# Patient Record
Sex: Female | Born: 1945 | Race: Black or African American | Hispanic: No | Marital: Married | Smoking: Never smoker
Health system: Southern US, Community
[De-identification: ages and names within clinical notes are randomized; demographics above are authoritative.]

## PROBLEM LIST (undated history)

## (undated) DIAGNOSIS — M109 Gout, unspecified: Secondary | ICD-10-CM

## (undated) DIAGNOSIS — I10 Essential (primary) hypertension: Secondary | ICD-10-CM

## (undated) DIAGNOSIS — E539 Vitamin B deficiency, unspecified: Secondary | ICD-10-CM

## (undated) HISTORY — DX: Essential (primary) hypertension: I10

## (undated) HISTORY — DX: Gout, unspecified: M10.9

## (undated) HISTORY — DX: Vitamin B deficiency, unspecified: E53.9

---

## 1998-10-11 ENCOUNTER — Encounter: Payer: Self-pay | Admitting: Internal Medicine

## 1998-10-11 ENCOUNTER — Ambulatory Visit (HOSPITAL_COMMUNITY): Admission: RE | Admit: 1998-10-11 | Discharge: 1998-10-11 | Payer: Self-pay | Admitting: Internal Medicine

## 1998-10-21 ENCOUNTER — Ambulatory Visit (HOSPITAL_COMMUNITY): Admission: RE | Admit: 1998-10-21 | Discharge: 1998-10-21 | Payer: Self-pay | Admitting: Internal Medicine

## 1999-10-13 ENCOUNTER — Ambulatory Visit (HOSPITAL_COMMUNITY): Admission: RE | Admit: 1999-10-13 | Discharge: 1999-10-13 | Payer: Self-pay | Admitting: Internal Medicine

## 1999-10-13 ENCOUNTER — Encounter: Payer: Self-pay | Admitting: Internal Medicine

## 1999-12-11 ENCOUNTER — Other Ambulatory Visit: Admission: RE | Admit: 1999-12-11 | Discharge: 1999-12-11 | Payer: Self-pay | Admitting: Internal Medicine

## 2000-10-14 ENCOUNTER — Ambulatory Visit (HOSPITAL_COMMUNITY): Admission: RE | Admit: 2000-10-14 | Discharge: 2000-10-14 | Payer: Self-pay | Admitting: Internal Medicine

## 2000-10-14 ENCOUNTER — Encounter: Payer: Self-pay | Admitting: Internal Medicine

## 2000-12-08 ENCOUNTER — Other Ambulatory Visit: Admission: RE | Admit: 2000-12-08 | Discharge: 2000-12-08 | Payer: Self-pay | Admitting: Internal Medicine

## 2001-03-31 ENCOUNTER — Ambulatory Visit (HOSPITAL_COMMUNITY): Admission: RE | Admit: 2001-03-31 | Discharge: 2001-03-31 | Payer: Self-pay | Admitting: Gastroenterology

## 2001-11-01 ENCOUNTER — Encounter: Payer: Self-pay | Admitting: Internal Medicine

## 2001-11-01 ENCOUNTER — Ambulatory Visit (HOSPITAL_COMMUNITY): Admission: RE | Admit: 2001-11-01 | Discharge: 2001-11-01 | Payer: Self-pay | Admitting: Internal Medicine

## 2002-01-27 ENCOUNTER — Other Ambulatory Visit: Admission: RE | Admit: 2002-01-27 | Discharge: 2002-01-27 | Payer: Self-pay | Admitting: Internal Medicine

## 2002-11-03 ENCOUNTER — Encounter: Payer: Self-pay | Admitting: Internal Medicine

## 2002-11-03 ENCOUNTER — Ambulatory Visit (HOSPITAL_COMMUNITY): Admission: RE | Admit: 2002-11-03 | Discharge: 2002-11-03 | Payer: Self-pay | Admitting: Internal Medicine

## 2003-12-12 ENCOUNTER — Ambulatory Visit (HOSPITAL_COMMUNITY): Admission: RE | Admit: 2003-12-12 | Discharge: 2003-12-12 | Payer: Self-pay | Admitting: Internal Medicine

## 2004-03-09 ENCOUNTER — Emergency Department (HOSPITAL_COMMUNITY): Admission: EM | Admit: 2004-03-09 | Discharge: 2004-03-09 | Payer: Self-pay | Admitting: Emergency Medicine

## 2005-01-08 ENCOUNTER — Ambulatory Visit (HOSPITAL_COMMUNITY): Admission: RE | Admit: 2005-01-08 | Discharge: 2005-01-08 | Payer: Self-pay | Admitting: Internal Medicine

## 2005-01-29 ENCOUNTER — Other Ambulatory Visit: Admission: RE | Admit: 2005-01-29 | Discharge: 2005-01-29 | Payer: Self-pay | Admitting: Internal Medicine

## 2005-06-25 ENCOUNTER — Ambulatory Visit (HOSPITAL_COMMUNITY): Admission: RE | Admit: 2005-06-25 | Discharge: 2005-06-25 | Payer: Self-pay | Admitting: Internal Medicine

## 2005-09-30 ENCOUNTER — Encounter: Payer: Self-pay | Admitting: Internal Medicine

## 2006-01-28 ENCOUNTER — Ambulatory Visit (HOSPITAL_COMMUNITY): Admission: RE | Admit: 2006-01-28 | Discharge: 2006-01-28 | Payer: Self-pay | Admitting: Internal Medicine

## 2007-02-14 ENCOUNTER — Ambulatory Visit (HOSPITAL_COMMUNITY): Admission: RE | Admit: 2007-02-14 | Discharge: 2007-02-14 | Payer: Self-pay | Admitting: Internal Medicine

## 2007-05-02 ENCOUNTER — Ambulatory Visit (HOSPITAL_COMMUNITY): Admission: RE | Admit: 2007-05-02 | Discharge: 2007-05-02 | Payer: Self-pay | Admitting: Internal Medicine

## 2007-05-02 ENCOUNTER — Ambulatory Visit: Payer: Self-pay | Admitting: Vascular Surgery

## 2007-05-02 ENCOUNTER — Encounter (INDEPENDENT_AMBULATORY_CARE_PROVIDER_SITE_OTHER): Payer: Self-pay | Admitting: Internal Medicine

## 2008-03-02 ENCOUNTER — Ambulatory Visit (HOSPITAL_COMMUNITY): Admission: RE | Admit: 2008-03-02 | Discharge: 2008-03-02 | Payer: Self-pay | Admitting: Internal Medicine

## 2008-04-24 ENCOUNTER — Other Ambulatory Visit: Admission: RE | Admit: 2008-04-24 | Discharge: 2008-04-24 | Payer: Self-pay | Admitting: Obstetrics and Gynecology

## 2008-04-24 ENCOUNTER — Encounter: Payer: Self-pay | Admitting: Obstetrics and Gynecology

## 2008-04-24 ENCOUNTER — Ambulatory Visit: Payer: Self-pay | Admitting: Obstetrics and Gynecology

## 2008-05-08 ENCOUNTER — Ambulatory Visit: Payer: Self-pay | Admitting: Obstetrics and Gynecology

## 2008-06-26 ENCOUNTER — Ambulatory Visit: Payer: Self-pay | Admitting: Obstetrics and Gynecology

## 2008-06-28 ENCOUNTER — Encounter: Payer: Self-pay | Admitting: Obstetrics and Gynecology

## 2008-06-28 ENCOUNTER — Ambulatory Visit (HOSPITAL_BASED_OUTPATIENT_CLINIC_OR_DEPARTMENT_OTHER): Admission: RE | Admit: 2008-06-28 | Discharge: 2008-06-28 | Payer: Self-pay | Admitting: Obstetrics and Gynecology

## 2008-06-28 ENCOUNTER — Ambulatory Visit: Payer: Self-pay | Admitting: Obstetrics and Gynecology

## 2008-07-12 ENCOUNTER — Ambulatory Visit: Payer: Self-pay | Admitting: Obstetrics and Gynecology

## 2009-06-27 ENCOUNTER — Ambulatory Visit (HOSPITAL_COMMUNITY): Admission: RE | Admit: 2009-06-27 | Discharge: 2009-06-27 | Payer: Self-pay | Admitting: Internal Medicine

## 2009-10-11 ENCOUNTER — Encounter: Admission: RE | Admit: 2009-10-11 | Discharge: 2009-10-11 | Payer: Self-pay | Admitting: Internal Medicine

## 2010-06-30 ENCOUNTER — Ambulatory Visit (HOSPITAL_COMMUNITY)
Admission: RE | Admit: 2010-06-30 | Discharge: 2010-06-30 | Payer: Self-pay | Source: Home / Self Care | Attending: Internal Medicine | Admitting: Internal Medicine

## 2010-09-23 ENCOUNTER — Other Ambulatory Visit: Payer: Self-pay | Admitting: Obstetrics and Gynecology

## 2010-09-23 ENCOUNTER — Other Ambulatory Visit (HOSPITAL_COMMUNITY)
Admission: RE | Admit: 2010-09-23 | Discharge: 2010-09-23 | Disposition: A | Discharge status: Home / Self Care | Payer: BC Managed Care – PPO | Source: Ambulatory Visit | Attending: Obstetrics and Gynecology | Admitting: Obstetrics and Gynecology

## 2010-09-23 ENCOUNTER — Encounter (INDEPENDENT_AMBULATORY_CARE_PROVIDER_SITE_OTHER): Payer: BC Managed Care – PPO | Admitting: Obstetrics and Gynecology

## 2010-09-23 DIAGNOSIS — R82998 Other abnormal findings in urine: Secondary | ICD-10-CM

## 2010-09-23 DIAGNOSIS — Z01419 Encounter for gynecological examination (general) (routine) without abnormal findings: Secondary | ICD-10-CM

## 2010-09-23 DIAGNOSIS — Z124 Encounter for screening for malignant neoplasm of cervix: Secondary | ICD-10-CM | POA: Insufficient documentation

## 2010-10-14 NOTE — Op Note (Signed)
Kristi [name], [name]              ACCOUNT NO.:  [id number]   MEDICAL RECORD NO.:  [id number]          PATIENT TYPE:  AMB   LOCATION:  [facility]                         FACILITY:  [facility]   PHYSICIAN:  Daniel L. Gottsegen, M.D.DATE OF BIRTH:  01-09-1946   DATE OF PROCEDURE:  06/28/2008  DATE OF DISCHARGE:                               OPERATIVE REPORT   PREOPERATIVE DIAGNOSIS:  Postmenopausal bleeding, endometrial polyps  retained intrauterine device.   POSTOPERATIVE DIAGNOSIS:  Postmenopausal bleeding, endometrial polyps,  endocervical polyp.  No intrauterine device seen.   OPERATION:  Hysteroscopy with excision of all polyps.   SURGEON:  Dr. Eda Paschal   ANESTHESIA:  General.   INDICATIONS:  The patient is a 65 year old gravida 6, para 1, AB 5 who  had presented to the office with a history postmenopausal bleeding after  not having had a period since the year 2000.  She was not taking hormone  replacement and therefore this was abnormal bleeding.  She was seen in  the office. Endometrial biopsy and Pap smear were both obtained, both of  which were normal. Saline infusion hysterogram was done. The patient had  several myomas and several endometrial polyps were seen which were felt  to probably be the source of the above. Her IUD could not be seen. One  of the myomas appeared to encroach on the endometrial cavity.  She now  enters the hospital for hysteroscopy D&C, excision of endometrial polyps  and excision of retained IUD if present.   FINDINGS:  External is normal.  BUS is normal.  Vaginal is normal.  Cervix is clean.  Uterus is top normal size and shape.  Adnexa failed to  reveal masses.  There is first-degree uterine descensus.   At the time of hysteroscopy, the patient had two well-defined  endometrial polyps.  The first was in the lower uterine segment at  approximately six o'clock, the second was in the right fundal area  higher up on the lateral wall.  Both polyps were  about 1 to 1.5 cm.  The  top of the fundus, tubal ostia, endometrial cavity was completely  visualized and there was absolutely no sign of a retained IUD.  Endocervical exam showed a polyp in the cervix coming off at 12 o'clock.  It was also about 1 cm.   PROCEDURE:  After adequate general anesthesia the patient was placed in  the dorsal lithotomy position, prepped and draped in usual sterile  manner.  A single-tooth tenaculum was placed in the anterior lip of the  cervix.  The cervix dilated to #31 [facility] dilator. Hysteroscopic  resectoscope was introduced into the endometrial cavity.  3% sorbitol  was used to expand the intrauterine cavity and a camera was used for  magnification.  A 90 degree wire loop with appropriate Bovie settings  was used.  The endometrial cavity was entered.  First a search was made  for the IUD and no IUD could be seen.  It was felt that probably the  patient had passed it as it was also not seen in the office on  ultrasound.  The two  endometrial polyps were removed with a 90 degree  wire loop by resection of the above.  A myoma was noted to impinge on  the endometrial cavity but it was clearly not in the cavity in any way.  Endocervical polyp was encountered and it was also excised with a 90  degree wire loop.  There was some bleeding from all three sites and they  were controlled with cautery.  At the termination of the procedure there  was absolutely no bleeding  noted.  The two endometrial polyps and the one endocervical polyp were  all sent to pathology for tissue diagnosis.  Blood loss was minimal.  Fluid deficit was 200 mL.  The patient tolerated procedure well and left  the operating room in satisfactory condition.      Daniel L. Eda Paschal, M.D.  Electronically Signed     DLG/MEDQ  D:  06/28/2008  T:  06/28/2008  Job:  [id number]

## 2010-10-17 NOTE — Procedures (Signed)
[facility]  Patient:    Kristi [name], Kristi [name] Visit Number: [id number] MRN: [id number]          Service Type: END Location: ENDO Attending Physician:  Charna Elizabeth Dictated by:   Anselmo Rod, M.D. Proc. Date: 03/31/01 Admit Date:  03/31/2001   CC:         Erskine Speed, M.D.   Procedure Report  DATE OF BIRTH:  Oct 05, 1945.  PROCEDURE:  Colonoscopy.  ENDOSCOPIST:  Anselmo Rod, M.D.  INSTRUMENT USED:  Olympus video colonoscope.  INDICATION FOR PROCEDURE:  Rectal bleeding in a 65 year old African-American female.  Rule out colonic polyps, masses, hemorrhoids, etc.  PREPROCEDURE PREPARATION:  Informed consent was procured from the patient. The patient was fasted for eight hours prior to the procedure and prepped with a bottle of magnesium citrate and a gallon of NuLytely the night prior to the procedure.  PREPROCEDURE PHYSICAL:  VITAL SIGNS:  The patient had stable vital signs.  NECK:  Supple.  CHEST:  Clear to auscultation.  S1, S2 regular.  ABDOMEN:  Soft with normal bowel sounds.  DESCRIPTION OF PROCEDURE:  The patient was placed in the left lateral decubitus position and sedated with 70 mg of Demerol and 7 mg of Versed intravenously.  Once the patient was adequately sedate and maintained on low-flow oxygen and continuous cardiac monitoring, the Olympus video colonoscope was advanced from the rectum to the cecum without difficulty.  The patient has a healthy-appearing colon except for a few scattered diverticula. Small internal hemorrhoids were appreciated on retroflexion in the rectum. The procedure was complete up to the terminal ileum.  The ileocecal valve and the appendiceal orifice were clearly visualized.  IMPRESSION: 1. A healthy-appearing colon except for a few scattered diverticula. 2. Small internal hemorrhoids.  RECOMMENDATIONS: 1. A high-fiber diet has been discussed with the patient in great detail  and    brochures given to her for her education. 2. Outpatient follow-up is advised on a p.r.n. basis. 3. Repeat colorectal cancer screening is recommended in the next five years    unless the patient were to develop any abnormal symptoms in the interim. Dictated by:   Anselmo Rod, M.D. Attending Physician:  Charna Elizabeth DD:  03/31/01 TD:  04/01/01 Job: [id number]

## 2011-08-24 ENCOUNTER — Other Ambulatory Visit (HOSPITAL_COMMUNITY): Payer: Self-pay | Admitting: Internal Medicine

## 2011-08-24 DIAGNOSIS — Z1231 Encounter for screening mammogram for malignant neoplasm of breast: Secondary | ICD-10-CM

## 2011-09-16 ENCOUNTER — Ambulatory Visit (HOSPITAL_COMMUNITY)
Admission: RE | Admit: 2011-09-16 | Discharge: 2011-09-16 | Disposition: A | Discharge status: Home / Self Care | Payer: Medicare Other | Source: Ambulatory Visit | Attending: Internal Medicine | Admitting: Internal Medicine

## 2011-09-16 DIAGNOSIS — Z1231 Encounter for screening mammogram for malignant neoplasm of breast: Secondary | ICD-10-CM | POA: Insufficient documentation

## 2011-11-09 ENCOUNTER — Other Ambulatory Visit: Payer: Self-pay | Admitting: Internal Medicine

## 2011-11-09 ENCOUNTER — Ambulatory Visit
Admission: RE | Admit: 2011-11-09 | Discharge: 2011-11-09 | Disposition: A | Discharge status: Home / Self Care | Payer: BC Managed Care – PPO | Source: Ambulatory Visit | Attending: Internal Medicine | Admitting: Internal Medicine

## 2011-11-09 DIAGNOSIS — R52 Pain, unspecified: Secondary | ICD-10-CM

## 2012-10-12 ENCOUNTER — Other Ambulatory Visit (HOSPITAL_COMMUNITY): Payer: Self-pay | Admitting: Internal Medicine

## 2012-10-12 DIAGNOSIS — Z1231 Encounter for screening mammogram for malignant neoplasm of breast: Secondary | ICD-10-CM

## 2012-10-19 ENCOUNTER — Ambulatory Visit (HOSPITAL_COMMUNITY): Payer: Medicare Other

## 2012-10-26 ENCOUNTER — Ambulatory Visit (HOSPITAL_COMMUNITY)
Admission: RE | Admit: 2012-10-26 | Discharge: 2012-10-26 | Disposition: A | Discharge status: Home / Self Care | Payer: Medicare Other | Source: Ambulatory Visit | Attending: Internal Medicine | Admitting: Internal Medicine

## 2012-10-26 DIAGNOSIS — Z1231 Encounter for screening mammogram for malignant neoplasm of breast: Secondary | ICD-10-CM

## 2013-09-25 ENCOUNTER — Other Ambulatory Visit (HOSPITAL_COMMUNITY): Payer: Self-pay | Admitting: Internal Medicine

## 2013-09-25 DIAGNOSIS — Z1231 Encounter for screening mammogram for malignant neoplasm of breast: Secondary | ICD-10-CM

## 2013-10-27 ENCOUNTER — Ambulatory Visit (HOSPITAL_COMMUNITY)
Admission: RE | Admit: 2013-10-27 | Discharge: 2013-10-27 | Disposition: A | Discharge status: Home / Self Care | Payer: Medicare Other | Source: Ambulatory Visit | Attending: Internal Medicine | Admitting: Internal Medicine

## 2013-10-27 DIAGNOSIS — Z1231 Encounter for screening mammogram for malignant neoplasm of breast: Secondary | ICD-10-CM | POA: Insufficient documentation

## 2013-11-21 ENCOUNTER — Ambulatory Visit
Admission: RE | Admit: 2013-11-21 | Discharge: 2013-11-21 | Disposition: A | Discharge status: Home / Self Care | Payer: 59 | Source: Ambulatory Visit | Attending: Internal Medicine | Admitting: Internal Medicine

## 2013-11-21 ENCOUNTER — Other Ambulatory Visit: Payer: Self-pay | Admitting: Internal Medicine

## 2013-11-21 DIAGNOSIS — R52 Pain, unspecified: Secondary | ICD-10-CM

## 2013-12-06 ENCOUNTER — Other Ambulatory Visit: Payer: Self-pay | Admitting: Internal Medicine

## 2013-12-06 DIAGNOSIS — R410 Disorientation, unspecified: Secondary | ICD-10-CM

## 2013-12-12 ENCOUNTER — Ambulatory Visit
Admission: RE | Admit: 2013-12-12 | Discharge: 2013-12-12 | Disposition: A | Discharge status: Home / Self Care | Payer: 59 | Source: Ambulatory Visit | Attending: Internal Medicine | Admitting: Internal Medicine

## 2013-12-12 DIAGNOSIS — R410 Disorientation, unspecified: Secondary | ICD-10-CM

## 2014-01-22 ENCOUNTER — Ambulatory Visit: Payer: Medicare Other | Admitting: Neurology

## 2014-01-25 ENCOUNTER — Ambulatory Visit: Payer: Medicare Other | Admitting: Neurology

## 2014-01-30 ENCOUNTER — Encounter: Payer: Self-pay | Admitting: Neurology

## 2014-01-30 ENCOUNTER — Ambulatory Visit (INDEPENDENT_AMBULATORY_CARE_PROVIDER_SITE_OTHER): Payer: Medicare Other | Admitting: Neurology

## 2014-01-30 VITALS — BP 150/80 | HR 78 | Temp 97.0°F | Ht 68.0 in | Wt 164.0 lb

## 2014-01-30 DIAGNOSIS — F0390 Unspecified dementia without behavioral disturbance: Secondary | ICD-10-CM

## 2014-01-30 MED ORDER — DONEPEZIL HCL 5 MG PO TABS: 5.0000 mg | ORAL_TABLET | Freq: Every day | ORAL | Status: DC

## 2014-01-30 NOTE — Progress Notes (Signed)
Subjective:    Patient ID: Kristi [name] is a 68 y.o. female.  HPI    Kristi Foley, MD, PhD [facility] [address] [address] [place], [place] [address]  Dear Dr. Chilton Si,   I saw your patient, Kristi [name], upon your kind request in my neurologic clinic today for initial consultation of her memory loss. The patient is accompanied by her husband, Kristi [name], today. As you know, Kristi [name] is a 68 year old right-handed woman with an underlying medical history of hypertension and arthritis/gout, who has had memory loss for the past 6-12 months. She had a brain MRI without contrast on 12/12/2013: 1. No evidence of acute intracranial abnormality or mass. 2. Mild chronic small vessel ischemic disease and mild cerebral atrophy. In addition I personally reviewed the images through the PACS system. She had blood work in your office on 11/28/2013 which showed a normal CBC with differential, normal CMP, normal thyroid function test, elevated LDL at 123, normal vitamin B12 at 346. She has had problems with short-term memory over the past, worse over the past 5-6 months. She got lost driving one time recently when picking up her grandchild. She became confused recently one time while grocery shopping. She was the only child and took care of both her parents. Her father lived to be 18 and died in 2014-07-12and her mother died 10 years earlier at 68. She has 2 grown daughters and they have 2 GC. 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 She does not smoke or drink EtOH. There is no FHx of dementia.  She primarily has been having difficulty with short-term memory such as forgetfulness, misplacing things, asking the same question again and forgetting dates and events. There is report of rare confusion or disorientation. Familiar faces are easily recognized. She reports no recurrent headaches, but reports one recent mild HA. She denies VH  and AH and delusions.  She denies depression. She denies SI/HI. She had a lot of stress when she took care of her parents.   The patient denies prior TIA or stroke symptoms, such as sudden onset of one sided weakness, numbness, tingling, slurring of speech or droopy face, hearing loss, tinnitus, diplopia or visual field cut or monocular loss of vision, and denies recurrent headaches.   Of note, the patient has very mild snoring, and there is no report of witnessed apneas or choking sensations while asleep.   She has reduced her driving.   Her Past Medical History Is Significant For: No past medical history on file.  Her Past Surgical History Is Significant For: No past surgical history on file.  Her Family History Is Significant For: Family History  Problem Relation Age of Onset  . Cancer Mother     skin     Her Social History Is Significant For: History   Social History  . Marital Status: Married    Spouse Name: Kristi [name]    Number of Children: 2  . Years of Education: 16   Occupational History  .      retired for 12 years   Social History Main Topics  . Smoking status: Never Smoker   . Smokeless tobacco: Never Used  . Alcohol Use: No  . Drug Use: No  . Sexual Activity: None   Other Topics Concern  . None   Social History Narrative   Patient is right handed and resides with husband    Her Allergies Are:  Not on File:   Her Current Medications Are:  No outpatient encounter prescriptions on file as of 01/30/2014.  :  Review of Systems:  Out of a complete 14 point review of systems, all are reviewed and negative with the exception of these symptoms as listed below:    Review of Systems  Cardiovascular:       Swelling in left foot, sometimes right(once)  Neurological:       Memory loss    Objective:  Neurologic Exam  Physical Exam Physical Examination:   Filed Vitals:   01/30/14 1357  BP: 150/80  Pulse: 78  Temp: 97 F (36.1 C)    General  Examination: The patient is a very pleasant 68 y.o. female in no acute distress. She is calm and cooperative with the exam. She denies Auditory Hallucinations and Visual Hallucinations. She is very well groomed and situated in a chair. She appears anxious/nervous.   HEENT: Normocephalic, atraumatic, pupils are equal, round and reactive to light and accommodation. Bilateral mild cataracts noted. Funduscopic exam is normal with sharp disc margins noted. Extraocular tracking shows no saccadic breakdown without nystagmus noted. Hearing is intact. Tympanic membranes are clear bilaterally. Face is symmetric with no facial masking and normal facial sensation. There is no lip, neck or jaw tremor. Neck is not rigid with intact passive ROM. There are no carotid bruits on auscultation. Oropharynx exam reveals moderate mouth dryness. No significant airway crowding is noted. Mallampati is class II. Tongue protrudes centrally and palate elevates symmetrically. Tonsils are absent.   Chest: is clear to auscultation without wheezing, rhonchi or crackles noted.  Heart: sounds are regular and normal without murmurs, rubs or gallops noted.   Abdomen: is soft, non-tender and non-distended with normal bowel sounds appreciated on auscultation.  Extremities: There is 1+ pitting edema in the distal lower extremities bilaterally. Pedal pulses are intact.   Skin: is warm and dry with no trophic changes noted.   Musculoskeletal: exam reveals no obvious joint deformities, tenderness or joint swelling or erythema, except for mild redness dorsum of the L foot and R big toe.   Neurologically:  Mental status: The patient is awake and alert, paying good  attention. She is able to partially provide the history. Her husband provides details. She is oriented to: person, place, situation and year. Her memory, attention, language and knowledge are impaired. There is no aphasia, agnosia, apraxia or anomia. There is a mild degree of  bradyphrenia. Speech is mildly hypophonic with no dysarthria noted. Mood is congruent and affect is normal.  Her MMSE (Mini-Mental state exam) score is 15/30.  CDT (Clock Drawing Test) score is 1/4.  AFT (Animal Fluency Test) score is 10.   Cranial nerves are as described above under HEENT exam. In addition, shoulder shrug is normal with equal shoulder height noted.  Motor exam: Normal bulk, and strength for age is noted. Tone is not rigid with absence of cogwheeling in the extremities. There is overall no bradykinesia. There is no drift or rebound. There is no tremor.  Romberg is negative. Reflexes are 1+ in the upper extremities and 1+ in the lower extremities. Toes are downgoing bilaterally. Fine motor skills: Finger taps, hand movements, and rapid alternating patting are preserved, as well as LE fine motor skills.   Cerebellar testing shows no dysmetria or intention tremor on finger to nose testing. Heel to shin is unremarkable. There is no truncal or gait ataxia.   Sensory exam is intact to light touch, pinprick, vibration,  temperature sense in the upper and lower extremities.   Gait, station and balance: She stands up from the seated position with mild difficulty. No veering to one side is noted. No leaning to one side. Posture is age appropriate. Stance is narrow-based. She turns en bloc and gait is normal.   Assessment and Plan:   In summary, Kristi [name] is a very pleasant 68 y.o.-year old female with an underlying medical history of hypertension and arthritis, who has had memory loss for the past year. She has had progressive short-term memory loss of the past 5-6 months and got lost while driving and familiar neighborhood recently. Her memory scores indicate moderate difficulty with memory. I would like to proceed with more detailed and formal memory testing in the form of neuropsychological evaluation. I had a long chat with the patient and her husband today about memory loss and we  talked about her recent brain MRI findings. She does not have much in the way of vascular risk factors and no family history of dementia. While she denies depression and would like to proceed with formal cognitive testing to also tease out if there is a component of stress, or depression, or mood disorder. Given her history and current findings I would like to go ahead and start the patient on Aricept low dose, 5 mg strength. I talked to her and her husband about this medication, its potential side effects and expectations. She has had recent appropriate blood work and brain scan and other than referral to neuropsychology we do not need to proceed with any diagnostic test at this time.  I answered all their questions today and the patient and Kristi [name] were in agreement with the above outlined plan. I would like to see the patient back in 3 months, sooner if the need arises and encouraged them to call with any interim questions, concerns, problems, updates and refill requests. Today I provided her with a new prescription for donepezil 5 mg strength. She is advised to stay well-hydrated, stay active mentally and physically, and ensure good nutrition.    Thank you very much for allowing me to participate in the care of this nice patient. If I can be of any further assistance to you please do not hesitate to call me at [phone].  Sincerely,   Kristi Foley, MD, PhD

## 2014-01-30 NOTE — Patient Instructions (Addendum)
You have complaints of memory loss: memory loss or changes in cognitive function can have many reasons. Conditions that can contribute to subjective or objective memory loss include: depression, stress, poor sleep from insomnia or sleep apnea, dehydration, fluctuation in blood sugar values, thyroid or electrolyte dysfunction. Dementia can be causes by stroke, brain atherosclerosis and by Alzheimer's disease or other, more rare and sometimes hereditary causes. We will do some additional testing: neuropsychological testing. We will start Aricept (generic name: donepezil) 5 mg: take one pill each evening. Common side effects include dry eyes, dry mouth, confusion, low pulse, low blood pressure and rare side effects include hallucinations.

## 2014-02-02 ENCOUNTER — Telehealth: Payer: Self-pay | Admitting: Neurology

## 2014-02-02 NOTE — Telephone Encounter (Signed)
Shared Dr Teofilo Pod message with patient's husband, he verbalized understanding

## 2014-02-02 NOTE — Telephone Encounter (Signed)
Called for more information on patient driving, lt VM for call back

## 2014-02-02 NOTE — Telephone Encounter (Signed)
Spouse questioning if patient is able to drive?  Please call and advise anytime, may leave detailed message if not available.

## 2014-02-02 NOTE — Telephone Encounter (Signed)
I would recommend that she not resume driving at this time. Let's also await the evaluation with the neuropsychologist.

## 2014-03-07 ENCOUNTER — Telehealth: Payer: Self-pay | Admitting: Neurology

## 2014-03-07 NOTE — Telephone Encounter (Signed)
I called back.  Spoke with Mr Janee Mornhompson.  They will contact pharmacy to request a refill.  I explained ins may not pay if it is too soon.  He will check with pharmacy and call us back if needed.  I called the pharmacy.  Spoke with Trinna PostAlex.  He said they will try to get a lost override, but some ins will not allow this.  I looked online at goodRx.com and printed/faxed a voucher to the pharmacy for Aricept.  In the event ins will not pay, the pharmacy can use this voucher and it will cost $27 for the patient to fill.  The info is as follows: Member ID: [id number] Rx Group: [id number] Rx Bin: [id number] Rx PCN: [id number]

## 2014-03-07 NOTE — Telephone Encounter (Signed)
Patient's spouse stated  donepezil (ARICEPT) 5 MG tablet medication has been lost and no where to be found.  Patient missed dose last night.  Please call and advise.

## 2014-04-24 ENCOUNTER — Other Ambulatory Visit: Payer: Self-pay | Admitting: Neurology

## 2014-05-01 ENCOUNTER — Ambulatory Visit (INDEPENDENT_AMBULATORY_CARE_PROVIDER_SITE_OTHER): Payer: Medicare Other | Admitting: Neurology

## 2014-05-01 ENCOUNTER — Encounter: Payer: Self-pay | Admitting: Neurology

## 2014-05-01 VITALS — BP 117/72 | HR 73 | Temp 98.6°F | Ht 68.0 in | Wt 162.0 lb

## 2014-05-01 DIAGNOSIS — F0390 Unspecified dementia without behavioral disturbance: Secondary | ICD-10-CM

## 2014-05-01 MED ORDER — DONEPEZIL HCL 10 MG PO TABS: 10.0000 mg | ORAL_TABLET | Freq: Every day | ORAL | Status: DC

## 2014-05-01 NOTE — Progress Notes (Signed)
Subjective:    Patient ID: Kristi [name] is a 68 y.o. female.  HPI     Interim history:  Kristi [name] is a 68 year old right-handed woman with an underlying medical history of hypertension and arthritis/gout, who presents for follow-up consultation of her memory loss. She's accompanied by her husband again today. I first met her on 01/30/2014, at which time she reported an approximately 6-12 month history of progressive memory loss. She had had recent blood work and a brain MRI which we discussed. I referred her for formal memory testing in the form of neuropsychological evaluation and she is been scheduled with Dr. Valentina Shaggy in January 2016. I suggested we start the patient on low-dose Aricept 5 mg once daily. I advised her not to resume driving at this time.  Today, she reports feeling stable. She has been able to tolerate low-dose Aricept. He does not report any acute issues. She does not notice any side effects. She is sometimes frustrated at herself. 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 She still has a tendency to try to help other people with their problems and sometimes it weighs heavily on her. She is trying to drink more water.  She had a brain MRI without contrast on 12/12/2013: 1. No evidence of acute intracranial abnormality or mass. 2. Mild chronic small vessel ischemic disease and mild cerebral atrophy. In addition I personally reviewed the images through the PACS system. She had blood work with her PCP on 11/28/2013 which showed a normal CBC with differential, normal CMP, normal thyroid function test, elevated LDL at 123, low normal vitamin B12 at 346. She has had problems with short-term memory over the past year, worse over the past 5-6 months. She got lost driving one time recently when picking up her grandchild. She became confused recently one time while grocery shopping. She is an only child and took care of both her parents. Her father lived to be 21 and died  in 12/27/12 and her mother died 40 years earlier at 64. She has 2 grown daughters and they have 2 GC. 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 She does not smoke or drink EtOH. There is no FHx of dementia.   She primarily has been having difficulty with short-term memory such as forgetfulness, misplacing things, asking the same question again and forgetting dates and events. There is report of rare confusion or disorientation. Familiar faces are easily recognized. She reports no recurrent headaches, but reports one recent mild HA. She denies VH and AH and delusions.   She denies depression. She denies SI/HI. She had a lot of stress when she took care of her parents.  The patient denies prior TIA or stroke symptoms, such as sudden onset of one sided weakness, numbness, tingling, slurring of speech or droopy face, hearing loss, tinnitus, diplopia or visual field cut or monocular loss of vision, and denies recurrent headaches.  Of note, the patient has very mild snoring, and there is no report of witnessed apneas or choking sensations while asleep.    Her Past Medical History Is Significant For: History reviewed. No pertinent past medical history.  Her Past Surgical History Is Significant For: History reviewed. No pertinent past surgical history.  Her Family History Is Significant For: Family History  Problem Relation Age of Onset  . Cancer Mother     skin     Her Social History Is Significant For: History   Social  History  . Marital Status: Married    Spouse Name: [name]    Number of Children: 2  . Years of Education: 16   Occupational History  .      retired for 12 years   Social History Main Topics  . Smoking status: Never Smoker   . Smokeless tobacco: Never Used  . Alcohol Use: No  . Drug Use: No  . Sexual Activity: None   Other Topics Concern  . None   Social History Narrative   Patient is right handed and resides with husband     Her Allergies Are:  Not on File:   Her Current Medications Are:  Outpatient Encounter Prescriptions as of 05/01/2014  Medication Sig  . allopurinol (ZYLOPRIM) 300 MG tablet Take 300 mg by mouth daily.  . hydrochlorothiazide (HYDRODIURIL) 25 MG tablet Take 25 mg by mouth daily.  Marland Kitchen losartan (COZAAR) 100 MG tablet Take 100 mg by mouth daily.  . [DISCONTINUED] donepezil (ARICEPT) 5 MG tablet TAKE 1 TABLET (5 MG TOTAL) BY MOUTH AT BEDTIME.  . colchicine 0.6 MG tablet Take 0.6 mg by mouth daily.  Marland Kitchen donepezil (ARICEPT) 10 MG tablet Take 1 tablet (10 mg total) by mouth at bedtime.  . [DISCONTINUED] cephALEXin (KEFLEX) 500 MG capsule Take 500 mg by mouth 2 (two) times daily.  . [DISCONTINUED] doxycycline (VIBRA-TABS) 100 MG tablet Take 100 mg by mouth 2 (two) times daily.  . [DISCONTINUED] etodolac (LODINE) 400 MG tablet Take 400 mg by mouth 3 (three) times daily with meals.  :  Review of Systems:  Out of a complete 14 point review of systems, all are reviewed and negative with the exception of these symptoms as listed below:   Review of Systems  All other systems reviewed and are negative.  No new symptoms.  Objective:  Neurologic Exam  Physical Exam Physical Examination:   Filed Vitals:   05/01/14 0934  BP: 117/72  Pulse: 73  Temp: 98.6 F (37 C)    General Examination: The patient is a very pleasant 68 y.o. female in no acute distress. She is calm and cooperative with the exam. She is very well groomed and situated in a chair. She appears slightly anxious/nervous.   HEENT: Normocephalic, atraumatic, pupils are equal, round and reactive to light and accommodation. Bilateral mild cataracts noted. Funduscopic exam is normal with sharp disc margins noted. Extraocular tracking shows no saccadic breakdown without nystagmus noted. Hearing is intact. Tympanic membranes are clear bilaterally. Face is symmetric with no facial masking and normal facial sensation. There is no lip, neck or  jaw tremor. Neck is not rigid with intact passive ROM. There are no carotid bruits on auscultation. Oropharynx exam reveals moderate mouth dryness. No significant airway crowding is noted. Mallampati is class II. Tongue protrudes centrally and palate elevates symmetrically. Tonsils are absent.   Chest: is clear to auscultation without wheezing, rhonchi or crackles noted.  Heart: sounds are regular and normal without murmurs, rubs or gallops noted.   Abdomen: is soft, non-tender and non-distended with normal bowel sounds appreciated on auscultation.  Extremities: There is no pitting edema in the distal lower extremities bilaterally. Pedal pulses are intact.   Skin: is warm and dry with no trophic changes noted.   Musculoskeletal: exam reveals no obvious joint deformities, tenderness or joint swelling or erythema.   Neurologically:  Mental status: The patient is awake and alert, paying good  attention. She is able to partially provide the history. Her husband provides details. She  is oriented to: person, place, situation and year. Her memory, attention, language and knowledge are impaired. There is no aphasia, agnosia, apraxia or anomia. There is a mild degree of bradyphrenia. Speech is mildly hypophonic with no dysarthria noted. Mood is congruent and affect is normal.   On 01/30/14: Her MMSE (Mini-Mental state exam) score is 15/30. CDT (Clock Drawing Test) score is 1/4. AFT (Animal Fluency Test) score is 10.   On 05/01/2014: MMSE: 15/30, CDT 1/4, AFT 8, geriatric depression score is 3 out of 15.   Cranial nerves are as described above under HEENT exam. In addition, shoulder shrug is normal with equal shoulder height noted.  Motor exam: Normal bulk, and strength for age is noted. Tone is not rigid with absence of cogwheeling in the extremities. There is overall no bradykinesia. There is no drift or rebound. There is no tremor.  Romberg is negative. Reflexes are 1+ in the upper extremities and 1+  in the lower extremities. Toes are downgoing bilaterally. Fine motor skills: Finger taps, hand movements, and rapid alternating patting are preserved, as well as LE fine motor skills.   Cerebellar testing shows no dysmetria or intention tremor on finger to nose testing. Heel to shin is unremarkable. There is no truncal or gait ataxia.   Sensory exam is intact to light touch, pinprick, vibration, temperature sense in the upper and lower extremities.   Gait, station and balance: She stands up from the seated position with mild difficulty. No veering to one side is noted. No leaning to one side. Posture is age appropriate. Stance is narrow-based. She turns en bloc and gait is normal.   Assessment and Plan:   In summary, BRYNNAN RODENBAUGH is a very pleasant 68 year old female with an underlying medical history of hypertension and arthritis, who has had memory loss for the past year. She has had progressive short-term memory loss of the past 6-9 months and got lost while driving in a familiar neighborhood. Her memory scores indicate moderate difficulty with memory, but numbers are generally speaking stable from September 2015. We will await more detailed and formal memory testing in the form of neuropsychological evaluation which is scheduled for January next year.  she has an appointment with me in March of next year which would be a good time to reevaluate her. In the interim, would like to increase her Aricept to 10 mg once daily. She has been able to tolerated at 5 mg strength. She is encouraged to drink more water. At some point next year we will repeat her brain MRI.   I answered all their questions today and the patient and [name] were in agreement with the above outlined plan. I would like to see the patient back in 3 months, sooner if the need arises and encouraged them to call with any interim questions, concerns, problems, updates and refill requests. Today I provided her with a new prescription for  donepezil 10 mg strength. She is advised to stay well-hydrated, stay active mentally and physically, and ensure good nutrition.

## 2014-05-01 NOTE — Patient Instructions (Signed)
We will await Dr. Maxwell MarionZelson's assessment in January. In the meantime, let's increase your Aricept to 10 mg daily. You can use the 5 mg pills and use 2 daily until finished and then you can fill the 10 mg pills.  I will see you back in 3 months.

## 2014-06-12 DIAGNOSIS — F0390 Unspecified dementia without behavioral disturbance: Secondary | ICD-10-CM

## 2014-07-31 ENCOUNTER — Encounter: Payer: Self-pay | Admitting: Neurology

## 2014-07-31 ENCOUNTER — Ambulatory Visit (INDEPENDENT_AMBULATORY_CARE_PROVIDER_SITE_OTHER): Payer: Medicare Other | Admitting: Neurology

## 2014-07-31 VITALS — BP 134/83 | HR 59 | Temp 98.2°F | Resp 14 | Ht 68.0 in | Wt 160.0 lb

## 2014-07-31 DIAGNOSIS — F0390 Unspecified dementia without behavioral disturbance: Secondary | ICD-10-CM | POA: Diagnosis not present

## 2014-07-31 DIAGNOSIS — E538 Deficiency of other specified B group vitamins: Secondary | ICD-10-CM

## 2014-07-31 MED ORDER — DONEPEZIL HCL 10 MG PO TABS: 10.0000 mg | ORAL_TABLET | Freq: Every day | ORAL | Status: DC

## 2014-07-31 NOTE — Progress Notes (Signed)
Subjective:    Patient ID: Kristi [name] is a 69 y.o. female.  HPI     Interim history:   Kristi [name] is a 69 year old right-handed woman with an underlying medical history of hypertension and arthritis/gout, who presents for follow-up consultation of her memory loss. She's accompanied by her husband again today. I last saw her on 05/01/2014, at which time I increased her Aricept from 5 mg to 10 mg. Her MMSE was stable at 15 out of 30 at the time. In the interim, she saw Dr. Valentina Shaggy on 06/12/2014 and I reviewed his neuropsychological test results and reported in detail. 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  Today, she reports doing well. She has been able to tolerate donepezil at 10 mg strength. She has not had any changes in her medications but she was diagnosed with B12 deficiency. This was actually diagnosed by her primary care physician some months or maybe even years ago. She has never been on injections with B12 and will be starting them next week. This will help her memory hopefully as well. She has no new complaints. She has not been driving. She tries to stay active mentally and physically. She tries to exercise regularly and walks a lot. She has not fallen thankfully. She has been eating healthy.   I first met her on 01/30/2014, at which time she reported an approximately 6-12 month history of progressive memory loss. She had had recent blood work and a brain MRI which we discussed. I referred her for formal memory testing in the form of neuropsychological evaluation and she is been scheduled with Dr. Valentina Shaggy in January 2016. I suggested we start the patient on low-dose Aricept 5 mg once daily. I advised her not to resume driving at this time.   She had a brain MRI without contrast on 12/12/2013: 1. No evidence of acute intracranial abnormality or mass. 2. Mild chronic small vessel ischemic disease and mild cerebral atrophy. In addition I  personally reviewed the images through the PACS system. She had blood work with her PCP on 11/28/2013 which showed a normal CBC with differential, normal CMP, normal thyroid function test, elevated LDL at 123, low normal vitamin B12 at 346. She has had problems with short-term memory over the past year, worse over the past 5-6 months. She got lost driving one time recently when picking up her grandchild. She became confused recently one time while grocery shopping. She is an only child and took care of both her parents. Her father lived to be 67 and died in 2012-12-11 and her mother died 40 years earlier at 6. She has 2 grown daughters and they have 2 GC. 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 She does not smoke or drink EtOH. There is no FHx of dementia.   She primarily has been having difficulty with short-term memory such as forgetfulness, misplacing things, asking the same question again and forgetting dates and events. There is report of rare confusion or disorientation. Familiar faces are easily recognized. She reports no recurrent headaches, but reports one recent mild HA. She denies VH and AH and delusions.   She denies depression. She denies SI/HI. She had a lot of stress when she took care of her parents.   The patient denies prior TIA or stroke symptoms, such as sudden onset of one sided weakness, numbness, tingling, slurring of speech or droopy face, hearing loss, tinnitus, diplopia or  visual field cut or monocular loss of vision, and denies recurrent headaches.   Of note, the patient has very mild snoring, and there is no report of witnessed apneas or choking sensations while asleep.    Her Past Medical History Is Significant For: No past medical history on file.  Her Past Surgical History Is Significant For: No past surgical history on file.  Her Family History Is Significant For: Family History  Problem Relation Age of Onset  . Cancer  Mother     skin     Her Social History Is Significant For: History   Social History  . Marital Status: Married    Spouse Name: [name]  . Number of Children: 2  . Years of Education: 16   Occupational History  .      retired for 12 years   Social History Main Topics  . Smoking status: Never Smoker   . Smokeless tobacco: Never Used  . Alcohol Use: No  . Drug Use: No  . Sexual Activity: Not on file   Other Topics Concern  . None   Social History Narrative   Patient is right handed and resides with husband    Her Allergies Are:  No Known Allergies:   Her Current Medications Are:  Outpatient Encounter Prescriptions as of 07/31/2014  Medication Sig  . allopurinol (ZYLOPRIM) 300 MG tablet Take 300 mg by mouth daily.  . colchicine 0.6 MG tablet Take 0.6 mg by mouth daily.  Marland Kitchen donepezil (ARICEPT) 10 MG tablet Take 1 tablet (10 mg total) by mouth at bedtime.  . hydrochlorothiazide (HYDRODIURIL) 25 MG tablet Take 25 mg by mouth daily.  Marland Kitchen losartan (COZAAR) 100 MG tablet Take 100 mg by mouth daily.  . [DISCONTINUED] donepezil (ARICEPT) 10 MG tablet Take 1 tablet (10 mg total) by mouth at bedtime.  :  Review of Systems:  Out of a complete 14 point review of systems, all are reviewed and negative with the exception of these symptoms as listed below:   Review of Systems  All other systems reviewed and are negative.   Objective:  Neurologic Exam  Physical Exam Physical Examination:   Filed Vitals:   07/31/14 1049  BP: 134/83  Pulse: 59  Temp: 98.2 F (36.8 C)  Resp: 14    General Examination: The patient is a very pleasant 69 y.o. female in no acute distress. She is calm and cooperative with the exam. She is very well groomed and situated in a chair. She is in good spirits today.  HEENT: Normocephalic, atraumatic, pupils are equal, round and reactive to light and accommodation. Bilateral mild cataracts noted. Funduscopic exam is normal with sharp disc margins noted.  Extraocular tracking shows no saccadic breakdown without nystagmus noted. Hearing is intact. Tympanic membranes are clear bilaterally. Face is symmetric with no facial masking and normal facial sensation. There is no lip, neck or jaw tremor. Neck is not rigid with intact passive ROM. There are no carotid bruits on auscultation. Oropharynx exam reveals moderate mouth dryness. No significant airway crowding is noted. Mallampati is class II. Tongue protrudes centrally and palate elevates symmetrically. Tonsils are absent.   Chest: is clear to auscultation without wheezing, rhonchi or crackles noted.  Heart: sounds are regular and normal without murmurs, rubs or gallops noted.   Abdomen: is soft, non-tender and non-distended with normal bowel sounds appreciated on auscultation.  Extremities: There is no pitting edema in the distal lower extremities bilaterally. Pedal pulses are intact.   Skin: is  warm and dry with no trophic changes noted.   Musculoskeletal: exam reveals no obvious joint deformities, tenderness or joint swelling or erythema.   Neurologically:  Mental status: The patient is awake and alert, paying good  attention. She is able to partially provide the history. Her husband provides details. She is oriented to: person, place, situation and year. Her memory, attention, language and knowledge are impaired. There is no aphasia, agnosia, apraxia or anomia. There is a mild degree of bradyphrenia. Speech is mildly hypophonic with no dysarthria noted. Mood is congruent and affect is normal.   On 01/30/14: Her MMSE (Mini-Mental state exam) score is 15/30. CDT (Clock Drawing Test) score is 1/4. AFT (Animal Fluency Test) score is 10.   On 05/01/2014: MMSE: 15/30, CDT 1/4, AFT 8, geriatric depression score is 3 out of 15.   On 07/31/2014: MMSE 18/30, CDT: 2/4, AFT 11, GDS 2/15.  Cranial nerves are as described above under HEENT exam. In addition, shoulder shrug is normal with equal shoulder height  noted.  Motor exam: Normal bulk, and strength for age is noted. Tone is not rigid with absence of cogwheeling in the extremities. There is overall no bradykinesia. There is no drift or rebound. There is no tremor.  Romberg is negative. Reflexes are 1+ in the upper extremities and 1+ in the lower extremities. Toes are downgoing bilaterally. Fine motor skills: Finger taps, hand movements, and rapid alternating patting are preserved, as well as LE fine motor skills.   Cerebellar testing shows no dysmetria or intention tremor on finger to nose testing. Heel to shin is unremarkable. There is no truncal or gait ataxia.   Sensory exam is intact to light touch, pinprick, vibration, temperature sense in the upper and lower extremities.   Gait, station and balance: She stands up from the seated position with mild difficulty. No veering to one side is noted. No leaning to one side. Posture is age appropriate. Stance is narrow-based. She turns en bloc and gait is normal but she does have mild difficulty with tandem walk.   Assessment and Plan:   In summary, NYARA CAPELL is a very pleasant 69 year old female with an underlying medical history of hypertension, gout and arthritis, who has had memory loss for the past 1+ year. She has had progressive short-term memory loss of the past 9+ months and got lost while driving in a familiar neighborhood in the recent past. Her memory scores indicate moderate difficulty with memory, but numbers are better today than 3 months ago. She has been on Aricept 10 mg strength and has been tolerating this. I think we ought to continue with this medication at this strength. I would not add a new drug at this time. I would like to follow her clinically. She had neuropsychological testing in January 2016 and we reviewed the report together. She's also had a follow-up with Dr. Valentina Shaggy to discuss findings in January. He also agreed that the patient should no longer be driving. I  encouraged her to stay active mentally and physically and to no longer drive. She is encouraged to exercise regularly and eat healthy and drink plenty of fluid. We will reevaluate her clinically and with memory scores in about 6 months, sooner if need be. she has been diagnosed with vitamin B12 deficiency and is to start vitamin B12 injections at her primary care physician's office next week. I answered all her questions today and they were encouraged to call with any interim problems or concerns. They  were in agreement.  I spent 30 minutes in total face-to-face time with the patient, more than 50% of which was spent in counseling and coordination of care, reviewing test results, reviewing medication and discussing or reviewing the diagnosis of dementia, its prognosis and treatment options.

## 2014-07-31 NOTE — Patient Instructions (Signed)
I think overall you are doing fairly well but I do want to suggest a few things today:  Remember to drink plenty of fluid, eat healthy meals and do not skip any meals. Try to eat protein with a every meal and eat a healthy snack such as fruit or nuts in between meals. Try to keep a regular sleep-wake schedule and try to exercise daily, particularly in the form of walking, 20-30 minutes a day, if you can. Good nutrition, proper sleep and exercise can help her cognitive function.  Engage in social activities in your community and with your family and try to keep up with current events by reading the newspaper or watching the news. If you have computer and can go online, try [email or web address]. Also, you may like to do word finding puzzles or crossword puzzles.  As far as your medications are concerned, I would like to suggest no new medication, no changes.    As far as diagnostic testing: no new test needed.   I would like to see you back in 6 months, sooner if we need to. Please call us with any interim questions, concerns, problems, updates or refill requests.  Our phone number is [phone]. We also have an after hours call service for urgent matters and there is a physician on-call for urgent questions. For any emergencies you know to call 911 or go to the nearest emergency room.

## 2014-11-26 ENCOUNTER — Other Ambulatory Visit: Payer: Self-pay

## 2015-01-31 ENCOUNTER — Encounter: Payer: Self-pay | Admitting: Neurology

## 2015-01-31 ENCOUNTER — Ambulatory Visit (INDEPENDENT_AMBULATORY_CARE_PROVIDER_SITE_OTHER): Payer: Medicare Other | Admitting: Neurology

## 2015-01-31 VITALS — BP 140/82 | HR 72 | Resp 16 | Ht 68.0 in | Wt 157.0 lb

## 2015-01-31 DIAGNOSIS — F0390 Unspecified dementia without behavioral disturbance: Secondary | ICD-10-CM

## 2015-01-31 DIAGNOSIS — F32A Depression, unspecified: Secondary | ICD-10-CM

## 2015-01-31 DIAGNOSIS — F329 Major depressive disorder, single episode, unspecified: Secondary | ICD-10-CM | POA: Diagnosis not present

## 2015-01-31 DIAGNOSIS — E538 Deficiency of other specified B group vitamins: Secondary | ICD-10-CM

## 2015-01-31 MED ORDER — MEMANTINE HCL ER 7 MG PO CP24: 7.0000 mg | ORAL_CAPSULE | Freq: Every day | ORAL | Status: DC

## 2015-01-31 NOTE — Patient Instructions (Addendum)
I would like to suggest starting you on a second memory medication, Namenda XR, starting at 7 mg once daily with gradual build up. Side effects include: nausea, confusion, hallucination, personality changes. If you are having mild side effects, try to stick with the treatment as these initial side effects may go away after the first 10-14 days.  We will continue with Aricept 10 mg once daily.     Talk to Dr. Chilton Si about your depressive symptoms.

## 2015-01-31 NOTE — Progress Notes (Signed)
Subjective:    Patient ID: Kristi [name] is a 69 y.o. female.  HPI     Interim history:   Ms. Kristi [name] is a 69 year old right-handed woman with an underlying medical history of hypertension and arthritis/gout, who presents for follow-up consultation of her memory loss. She's accompanied by her daughter and baby GD today. I last saw her on 07/31/2014, at which time she reported doing well. She had been able to tolerate donepezil at 10 mg strength. She had not had any changes in her medications but she was diagnosed with B12 deficiency. This was actually diagnosed by her primary care physician some months or maybe even years ago. She had never been on injections with B12 and was supposed to start them the week after. She had no new complaints. She was driving. She was trying to stay active mentally and physically. She was trying to work out regularly and was trying to eat healthy. Her MMSE was 18/30, CDT 2/4, and AFT was 11 per minute. I did not suggest any new medications, especially in light of her diagnosis of B12 deficiency and starting B12 injections soon.   Today, 01/31/2015: She reports having had more stress d/t her husband's interim illness and surgery with prolonged recovery. Kristi [name] reports that there have been minor issues with confusion, orientation, word finding difficulty. She has been on B12 injections, now supposed to be monthly. She would like to be able to drive and run small errands. Her 2 daughters have been very helpful and neighbors and friends and her daughters bring food. She has someone to help clean the house. She has had some depressive symptoms but nothing sustained.  Previously:  I saw her on 05/01/2014, at which time I increased her Aricept from 5 mg to 10 mg. Her MMSE was stable at 15 out of 30 at the time. In the interim, she saw Dr. Valentina [name] on 06/12/2014 and I reviewed his neuropsychological test results and reported in detail. Findings are suggestive of mild dementia,  possibly of the Alzheimer's type.  I first met her on 01/30/2014, at which time she reported an approximately 6-12 month history of progressive memory loss. She had had recent blood work and a brain MRI which we discussed. I referred her for formal memory testing in the form of neuropsychological evaluation and she is been scheduled with Dr. Valentina [name] in January 2016. I suggested we start the patient on low-dose Aricept 5 mg once daily. I advised her not to resume driving at this time.   She had a brain MRI without contrast on 12/12/2013: 1. No evidence of acute intracranial abnormality or mass. 2. Mild chronic small vessel ischemic disease and mild cerebral atrophy. In addition I personally reviewed the images through the PACS system. She had blood work with her PCP on 11/28/2013 which showed a normal CBC with differential, normal CMP, normal thyroid function test, elevated LDL at 123, low normal vitamin B12 at 346. She has had problems with short-term memory over the past year, worse over the past 5-6 months. She got lost driving one time recently when picking up her grandchild. She became confused recently one time while grocery shopping. She is an only child and took care of both her parents. Her father lived to be 65 and died in 12/16/12 and her mother died 70 years earlier at 51. She has 2 grown daughters and they have 2 GC. 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 She does  not smoke or drink EtOH. There is no FHx of dementia.   She primarily has been having difficulty with short-term memory such as forgetfulness, misplacing things, asking the same question again and forgetting dates and events. There is report of rare confusion or disorientation. Familiar faces are easily recognized. She reports no recurrent headaches, but reports one recent mild HA. She denies VH and AH and delusions.   She denies depression. She denies SI/HI. She had a lot of stress when  she took care of her parents.   The patient denies prior TIA or stroke symptoms, such as sudden onset of one sided weakness, numbness, tingling, slurring of speech or droopy face, hearing loss, tinnitus, diplopia or visual field cut or monocular loss of vision, and denies recurrent headaches.   Of note, the patient has very mild snoring, and there is no report of witnessed apneas or choking sensations while asleep.     Her Past Medical History Is Significant For: Past Medical History  Diagnosis Date  . Hypertension   . Gout   . Vitamin B deficiency     Her Past Surgical History Is Significant For: No past surgical history on file.  Her Family History Is Significant For: Family History  Problem Relation Age of Onset  . Cancer Mother     skin     Her Social History Is Significant For: Social History   Social History  . Marital Status: Married    Spouse Name: [name] Kristi  . Number of Children: 2  . Years of Education: 16   Occupational History  .      retired for 12 years   Social History Main Topics  . Smoking status: Never Smoker   . Smokeless tobacco: Never Used  . Alcohol Use: No  . Drug Use: No  . Sexual Activity: Not Asked   Other Topics Concern  . None   Social History Narrative   Patient is right handed and resides with husband    Her Allergies Are:  No Known Allergies:   Her Current Medications Are:  Outpatient Encounter Prescriptions as of 01/31/2015  Medication Sig  . allopurinol (ZYLOPRIM) 300 MG tablet Take 300 mg by mouth daily.  . colchicine 0.6 MG tablet Take 0.6 mg by mouth daily.  . Cyanocobalamin (B-12) 1000 MCG/ML KIT Inject as directed every 30 (thirty) days.  Marland Kitchen donepezil (ARICEPT) 10 MG tablet Take 1 tablet (10 mg total) by mouth at bedtime.  Marland Kitchen losartan (COZAAR) 100 MG tablet Take 100 mg by mouth daily.  . memantine (NAMENDA XR) 7 MG CP24 24 hr capsule Take 1 capsule (7 mg total) by mouth daily.  . [DISCONTINUED] hydrochlorothiazide  (HYDRODIURIL) 25 MG tablet Take 25 mg by mouth daily.   No facility-administered encounter medications on file as of 01/31/2015.  :  Review of Systems:  Out of a complete 14 point review of systems, all are reviewed and negative with the exception of these symptoms as listed below:  Review of Systems  Constitutional:       Patient states that she would like to discuss driving and going to places like the grocery store.   Weight loss reported.   Musculoskeletal:       Patient c/o lower back pain and gout flair-ups.   Neurological: Positive for headaches.       Patient reports feeling sleepy during the day. Patient states that she feels uncomfortable at times due to memory loss.     Objective:  Neurologic Exam  Physical Exam Physical Examination:   Filed Vitals:   01/31/15 1100  BP: 140/82  Pulse: 72  Resp: 16    General Examination: The patient is a very pleasant 69 y.o. female in no acute distress. She is calm and cooperative with the exam. She is very well groomed and situated in a chair. She is in good spirits today.  HEENT: Normocephalic, atraumatic, pupils are equal, round and reactive to light and accommodation. Bilateral mild cataracts noted. Funduscopic exam is normal with sharp disc margins noted. Extraocular tracking shows no saccadic breakdown without nystagmus noted. Hearing is intact. Tympanic membranes are clear bilaterally. Face is symmetric with no facial masking and normal facial sensation. There is no lip, neck or jaw tremor. Neck is not rigid with intact passive ROM. There are no carotid bruits on auscultation. Oropharynx exam reveals moderate mouth dryness. No significant airway crowding is noted. Mallampati is class II. Tongue protrudes centrally and palate elevates symmetrically. Tonsils are absent.   Chest: is clear to auscultation without wheezing, rhonchi or crackles noted.  Heart: sounds are regular and normal without murmurs, rubs or gallops noted.    Abdomen: is soft, non-tender and non-distended with normal bowel sounds appreciated on auscultation.  Extremities: There is no pitting edema in the distal lower extremities bilaterally. Pedal pulses are intact.   Skin: is warm and dry with no trophic changes noted.   Musculoskeletal: exam reveals no obvious joint deformities, tenderness or joint swelling or erythema.   Neurologically:  Mental status: The patient is awake and alert, paying good  attention. She is able to partially provide the history. Her husband provides details. She is oriented to: person, place, situation and year. Her memory, attention, language and knowledge are impaired. There is no aphasia, agnosia, apraxia or anomia. There is a mild degree of bradyphrenia. Speech is mildly hypophonic with no dysarthria noted. Mood is congruent and affect is normal.   On 01/30/14: Her MMSE (Mini-Mental state exam) score is 15/30. CDT (Clock Drawing Test) score is 1/4. AFT (Animal Fluency Test) score is 10.   On 05/01/2014: MMSE: 15/30, CDT 1/4, AFT 8, geriatric depression score is 3 out of 15.   On 07/31/2014: MMSE 18/30, CDT: 2/4, AFT 11, GDS 2/15.  On 01/31/2015: MMSE: 14/30, CDT: 1/4, AFT: 8/min.   Cranial nerves are as described above under HEENT exam. In addition, shoulder shrug is normal with equal shoulder height noted.  Motor exam: Normal bulk, and strength for age is noted. Tone is not rigid with absence of cogwheeling in the extremities. There is overall no bradykinesia. There is no drift or rebound. There is no tremor.  Romberg is negative. Reflexes are 1+ in the upper extremities and 1+ in the lower extremities. Toes are downgoing bilaterally. Fine motor skills: Finger taps, hand movements, and rapid alternating patting are preserved, as well as LE fine motor skills.   Cerebellar testing shows no dysmetria or intention tremor. There is no truncal or gait ataxia.   Sensory exam is intact to light touch, pinprick, vibration,  temperature sense in the upper and lower extremities.   Gait, station and balance: She stands up from the seated position with mild difficulty. No veering to one side is noted. No leaning to one side. Posture is age appropriate. Stance is narrow-based. She turns en bloc and gait is normal but she does have mild difficulty with tandem walk.   Assessment and Plan:   In summary, YARELIS AMBROSINO is a very pleasant 69 year old  female with an underlying medical history of hypertension, gout and arthritis, who has had memory loss for the past 1 to 2 years. She has had progressive short-term memory loss of the past nearly one year and has a history of getting lost while driving in a familiar neighborhood in the recent past. Her memory scores indicate moderate dementia and numbers are worse today from 6 months ago. She has been on Aricept 10 mg strength and has been tolerating this. I think we ought to add a second medication at this time, Namenda XR 7 mg daily and continue with Aricept. I would like for her to talk to her primary care physician about the status of her B12 and monthly injections as well as her depressive symptoms. For now we will monitor. She has had more stress lately what with her husband's illness and prolonged recovery. I talked to the patient and her daughter at length today. She had neuropsychological testing in January 2016 and we reviewed the report  before. I told her that I would not suggest she resume driving. Dr Kristi [name] also agreed that the patient should no longer be driving. I encouraged her to stay active mentally and physically and to no longer drive. She is encouraged to exercise regularly and eat healthy and drink plenty of fluid. We will reevaluate her clinically  with a nurse practitioner visit in 2 months and I will see her back in about 4 months. I answered all their questions today and the patient and her daughter were in agreement.  I spent 25 minutes in total face-to-face  time with the patient, more than 50% of which was spent in counseling and coordination of care, reviewing test results, reviewing medication and discussing or reviewing the diagnosis of dementia, its prognosis and treatment options.

## 2015-04-03 ENCOUNTER — Encounter: Payer: Self-pay | Admitting: Nurse Practitioner

## 2015-04-03 ENCOUNTER — Telehealth: Payer: Self-pay | Admitting: Neurology

## 2015-04-03 ENCOUNTER — Ambulatory Visit (INDEPENDENT_AMBULATORY_CARE_PROVIDER_SITE_OTHER): Payer: Medicare Other | Admitting: Nurse Practitioner

## 2015-04-03 ENCOUNTER — Other Ambulatory Visit: Payer: Self-pay | Admitting: Nurse Practitioner

## 2015-04-03 VITALS — BP 154/84 | HR 60 | Ht 67.0 in | Wt 158.2 lb

## 2015-04-03 DIAGNOSIS — E538 Deficiency of other specified B group vitamins: Secondary | ICD-10-CM | POA: Diagnosis not present

## 2015-04-03 DIAGNOSIS — F329 Major depressive disorder, single episode, unspecified: Secondary | ICD-10-CM

## 2015-04-03 DIAGNOSIS — F0390 Unspecified dementia without behavioral disturbance: Secondary | ICD-10-CM | POA: Diagnosis not present

## 2015-04-03 DIAGNOSIS — F3289 Other specified depressive episodes: Secondary | ICD-10-CM

## 2015-04-03 MED ORDER — MEMANTINE HCL ER 28 MG PO CP24: 28.0000 mg | ORAL_CAPSULE | Freq: Every day | ORAL | Status: DC

## 2015-04-03 MED ORDER — DONEPEZIL HCL 10 MG PO TABS: 10.0000 mg | ORAL_TABLET | Freq: Every day | ORAL | Status: DC

## 2015-04-03 NOTE — Patient Instructions (Signed)
Namenda 14 mg for one week, 21 mg for one week then 28 mg daily Given co pay card for 1 free month of Namenda Continue Aricept 10 mg daily  Exercise by walking or any other type of exercise that you enjoy every day Follow-up in 3 months

## 2015-04-03 NOTE — Telephone Encounter (Signed)
Let's go slower with the titration of Namenda XR, for now stay on the new dose of 14 mg daily, she can take at night if it makes her sleepy.  Side effects include: nausea, confusion, hallucination, personality changes, headaches. If she is having mild side effects, please encourage her to try to stick with the treatment as these initial side effects may go away after the first 10-14 days.    Please call daughter back, thx

## 2015-04-03 NOTE — Progress Notes (Addendum)
[facility]  PATIENT: Kristi [name] DOB: 1945/09/12   REASON FOR VISIT: Follow-up for dementia, B12 deficiency, depressive state HISTORY FROM: Patient and brother-in-law    HISTORY OF PRESENT ILLNESS: HISTORY: Kristi [name] is a 69 year old right-handed woman with an underlying medical history of hypertension and arthritis/gout, who presents for follow-up consultation of her memory loss. She's accompanied by her daughter and baby Kristi [name] today. I last saw her on 07/31/2014, at which time she reported doing well. She had been able to tolerate donepezil at 10 mg strength. She had not had any changes in her medications but she was diagnosed with B12 deficiency. This was actually diagnosed by her primary care physician some months or maybe even years ago. She had never been on injections with B12 and was supposed to start them the week after. She had no new complaint of a.m. one time that didn't touch my note of Dr. Servando Salina and he gone [place] to touch my unable to decline he was here in August 1930 O Hulen Skains was delayed with delayed K kidney from following your length and reciprocal BC walking is rolled to hemorrhoids so with her orthopedist. Along with s. She was driving. She was trying to stay active mentally and physically. She was trying to work out regularly and was trying to eat healthy. Her MMSE was 18/30, CDT 2/4, and AFT was 11 per minute. I did not suggest any new medications, especially in light of her diagnosis of B12 deficiency and starting B12 injections soon.   UPDATE 11/2/2016CM: Kristi [name], 69 year old female returns for follow-up. She has previously been seen by Dr. Rexene Alberts. She has a history of dementia and B12 deficiency along with some depression. She returns today with her brother-in-law. She is currently on Aricept 10 mg daily and Namenda 7 mg. The medication has not been titrated. She continues with stress d/t her husband's interim illness and surgery with prolonged  recovery. Brother-in-law reports  minor issues with confusion, orientation, word finding difficulty. She is  on B12 injections,  monthly. She would like to be able to drive and run small errands. Her 2 daughters have been very helpful and neighbors and friends and her daughters bring food. She has someone to help clean the house. She has had some depressive symptoms but nothing sustained. She states today she is ready to get out of the house.  Dr. Valentina Shaggy saw the patient  on 06/12/2014 and I reviewed his neuropsychological test results and reported in detail. 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 She had a brain MRI without contrast on 12/12/2013: 1. No evidence of acute intracranial abnormality or mass. 2. Mild chronic small vessel ischemic disease and mild cerebral atrophy. She returns for reevaluation.   REVIEW OF SYSTEMS: Full 14 system review of systems performed and notable only for those listed, all others are neg:  Constitutional: Fatigue  Cardiovascular: neg Ear/Nose/Throat: neg  Skin: neg Eyes: neg Respiratory: neg Gastroitestinal: neg  Hematology/Lymphatic: neg  Endocrine: neg Musculoskeletal: Joint pain, back pain, muscle cramps Allergy/Immunology: neg Neurological: Memory loss Psychiatric: Mild depression Sleep : Mild snoring   ALLERGIES: No Known Allergies  HOME MEDICATIONS: Outpatient Prescriptions Prior to Visit  Medication Sig Dispense Refill  . allopurinol (ZYLOPRIM) 300 MG tablet Take 300 mg by mouth daily.  12  . colchicine 0.6 MG tablet Take 0.6 mg by mouth daily.  12  . Cyanocobalamin (B-12) 1000 MCG/ML KIT Inject as directed every 30 (thirty) days.    Marland Kitchen  donepezil (ARICEPT) 10 MG tablet Take 1 tablet (10 mg total) by mouth at bedtime. 30 tablet 5  . losartan (COZAAR) 100 MG tablet Take 100 mg by mouth daily.    . memantine (NAMENDA XR) 7 MG CP24 24 hr capsule Take 1 capsule (7 mg total) by mouth daily. 30 capsule 3   No  facility-administered medications prior to visit.    PAST MEDICAL HISTORY: Past Medical History  Diagnosis Date  . Hypertension   . Gout   . Vitamin B deficiency     PAST SURGICAL HISTORY: History reviewed. No pertinent past surgical history.  FAMILY HISTORY: Family History  Problem Relation Age of Onset  . Cancer Mother     skin     SOCIAL HISTORY: Social History   Social History  . Marital Status: Married    Spouse Name: Kristi [name]  . Number of Children: 2  . Years of Education: 16   Occupational History  .      retired for 12 years   Social History Main Topics  . Smoking status: Never Smoker   . Smokeless tobacco: Never Used  . Alcohol Use: No  . Drug Use: No  . Sexual Activity: Not on file   Other Topics Concern  . Not on file   Social History Narrative   Patient is right handed and resides with husband     PHYSICAL EXAM  Filed Vitals:   04/03/15 0911  BP: 154/84  Pulse: 60  Height: _0  (1.702 m)  Weight: 158 lb 3.2 oz (71.759 kg)   Body mass index is 24.77 kg/(m^2).  Generalized: Well developed, in no acute distress , well groomed Head: normocephalic and atraumatic,. Oropharynx benign  Neck: Supple, no carotid bruits  Cardiac: Regular rate rhythm, no murmur  Musculoskeletal: No deformity   Neurological examination   Mentation: The patient is awake alert and able to provide some history. Her attention and knowledge are impaired. Speech is mildly hypophonic , she has some word finding difficulty   Follows all commands. MMSE 17/30 AFT 5 Clock drawing 0/4. Last On 01/31/2015: MMSE: 14/30, CDT: 1/4, AFT: 8/min.  Cranial nerve II-XII: Fundoscopic exam reveals sharp disc margins.Pupils were equal round reactive to light extraocular movements were full, visual field were full on confrontational test. Facial sensation and strength were normal. hearing was intact to finger rubbing bilaterally. Uvula tongue midline. head turning and shoulder shrug were normal  and symmetric.Tongue protrusion into cheek strength was normal. Motor: normal bulk and tone, full strength in the BUE, BLE, fine finger movements normal, no pronator drift. No focal weakness Sensory: normal and symmetric to light touch, pinprick, and  Vibration, proprioception  Coordination: finger-nose-finger, heel-to-shin bilaterally, no dysmetria Reflexes: Brachioradialis 2/2, biceps 2/2, triceps 2/2, patellar 2/2, Achilles 2/2, plantar responses were flexor bilaterally. Gait and Station: Rising up from seated position without assistance, narrrow based stance,  moderate stride, good arm swing, smooth turning, able to perform tiptoe, and heel walking without difficulty. Tandem gait is mildly unsteady  DIAGNOSTIC DATA (LABS, IMAGING, TESTING) - ASSESSMENT AND PLAN  69 y.o. year old female  has a past medical history of Hypertension; Gout; and Vitamin B deficiency. Dementia without behavioral disturbance and mild depressive state here to follow-up. She is currently on Aricept 10 mg daily and Namenda extended release 7 mg daily. Her memory scores indicate moderate dementia.The patient is a current patient of Dr. Rexene Alberts  who is out of the office today . This note is sent to the  work in Tax adviser.     I discussed with the brother-in-law that she should no longer be driving. I encouraged her to stay active mentally and physically. She is encouraged to exercise regularly and eat healthy and drink plenty of fluid. I answered their questions today and the patient and her brother in law  were in agreement. Increase Namenda 14 mg for one week, 21 mg for one week then 28 mg daily Given co pay card for 1 free month of Namenda with RX  Continue Aricept 10 mg daily  Follow-up in 3 months  I Spent 25 minutes in total face-to-face time with the patient and the brother in law , more than 50% of which was spent in counseling and coordination of care, reviewing test results, reviewing medication and discussing or  reviewing the diagnosis of dementia, its prognosis and treatment options. Dennie Bible, [facility], [facility], APRN  [facility] [address] [place], [place] [address] [phone]  Personally examined patient and images, and have participated in and made any corrections needed to history, physical, neuro exam,assessment and plan as stated above.  Sarina Ill, MD [facility]

## 2015-04-03 NOTE — Telephone Encounter (Signed)
Brother-in-law took Kristi [name] to appt today. Daughter called to say that Kristi [name] currently takes Namenda XR 7mg  in the am. She feels that the medication makes her sleepy and has headaches. [name] (daughter) is concerned about going up on dosage and wants to know if you still recommend going up on dosage? If so, what kind of side effects can they expect?

## 2015-04-03 NOTE — Telephone Encounter (Signed)
Phone call from Jillian/daughter [phone] called to advise that  memantine (NAMENDA XR) 28 MG CP24 24 hr capsule is making her very sleepy and having headaches. Should patient still do the increased dose?

## 2015-04-03 NOTE — Telephone Encounter (Signed)
I clarified with Eber Jonesarolyn NP that patient was given a titration pack and should only be taking 14mg  right now. I left a message for the daughter to call back. Left the titration instructions on vm and asked her to call back and verify understanding.

## 2015-04-04 NOTE — Telephone Encounter (Signed)
Per daughter ok to leave detailed message. I left information and recommendations below. I asked her to call back if they need 14mg  prescription sent in for patient.

## 2015-04-08 ENCOUNTER — Other Ambulatory Visit: Payer: Self-pay

## 2015-04-08 MED ORDER — MEMANTINE HCL ER 14 MG PO CP24
14.0000 mg | ORAL_CAPSULE | Freq: Every day | ORAL | Status: DC
Start: 2015-04-08 — End: 2015-05-21

## 2015-04-08 NOTE — Telephone Encounter (Signed)
Daughter called and stated her mother would like to have the new Rx Namenda Xr 14 mg called to [facility] [address]  Thanks!

## 2015-04-08 NOTE — Telephone Encounter (Signed)
Rx has been sent.  Reciept confirmed by pharmacy.   

## 2015-05-21 ENCOUNTER — Other Ambulatory Visit: Payer: Self-pay | Admitting: Neurology

## 2015-06-06 ENCOUNTER — Encounter: Payer: Self-pay | Admitting: Neurology

## 2015-06-06 ENCOUNTER — Ambulatory Visit (INDEPENDENT_AMBULATORY_CARE_PROVIDER_SITE_OTHER): Payer: Medicare Other | Admitting: Neurology

## 2015-06-06 VITALS — BP 142/86 | HR 72 | Resp 14 | Ht 67.0 in | Wt 163.0 lb

## 2015-06-06 DIAGNOSIS — F32A Depression, unspecified: Secondary | ICD-10-CM

## 2015-06-06 DIAGNOSIS — F329 Major depressive disorder, single episode, unspecified: Secondary | ICD-10-CM | POA: Diagnosis not present

## 2015-06-06 DIAGNOSIS — E538 Deficiency of other specified B group vitamins: Secondary | ICD-10-CM

## 2015-06-06 DIAGNOSIS — F0390 Unspecified dementia without behavioral disturbance: Secondary | ICD-10-CM | POA: Diagnosis not present

## 2015-06-06 MED ORDER — ESCITALOPRAM OXALATE 5 MG PO TABS: 5.0000 mg | ORAL_TABLET | Freq: Every day | ORAL | Status: DC

## 2015-06-06 MED ORDER — MEMANTINE HCL ER 14 MG PO CP24: 14.0000 mg | ORAL_CAPSULE | Freq: Every day | ORAL | Status: DC

## 2015-06-06 NOTE — Progress Notes (Signed)
Subjective:    Patient ID: Kristi [name] is a 70 y.o. female.  HPI     Interim history:   Ms. Kristi [name] is a 70 year old right-handed woman with an underlying medical history of hypertension and arthritis/gout, who presents for follow-up consultation of her memory loss. She's accompanied by her daughter today. I last saw her on 01/31/2015, at which time she reported having more stress d/t her husband's interim illness and surgery with prolonged recovery. Her daughter reported that she was having difficulty with confusion, orientation, word finding difficulties. She was on B12 injections. Her daughters were helpful and she also had help from neighbors and friends.  Her MMSE was 14/30, CDT: 1/4, AFT: 8/min. I suggested we continue with generic Aricept 10 mg once daily but add Namenda long-acting 7 mg once daily to it. She was seen in the interim by Cecille Rubin, NP on 04/03/2015, at which time Namenda XR was increased to 14 mg once daily. Her MMSE was 17/30 at the time, AFT 5/min.  Today, 06/06/2015: She reports overall doing fairly well. She had no interim illness. She has had more depressive symptoms. She worries about her husband who has had additional medical problems and needs assistance sometimes at night. She listens out for him at night. She goes to bed around 9 PM but is often up by 3 AM. She is sleepy during the day some. When she is physically active she does not doze off but the moment she is sedentary she can doze off easily. She has help at the house. Her daughters are involved in her care and other family members as well.  Previously:  I saw her on 07/31/2014, at which time she reported doing well. She had been able to tolerate donepezil at 10 mg strength. She had not had any changes in her medications but she was diagnosed with B12 deficiency. This was actually diagnosed by her primary care physician some months or maybe even years ago. She had never been on injections with B12 and  was supposed to start them the week after. She had no new complaints. She was driving. She was trying to stay active mentally and physically. She was trying to work out regularly and was trying to eat healthy. Her MMSE was 18/30, CDT 2/4, and AFT was 11 per minute. I did not suggest any new medications, especially in light of her diagnosis of B12 deficiency and starting B12 injections soon.   I saw her on 05/01/2014, at which time I increased her Aricept from 5 mg to 10 mg. Her MMSE was stable at 15 out of 30 at the time. In the interim, she saw Dr. Valentina Shaggy on 06/12/2014 and I reviewed his neuropsychological test results and reported in detail. 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  I first met her on 01/30/2014, at which time she reported an approximately 6-12 month history of progressive memory loss. She had had recent blood work and a brain MRI which we discussed. I referred her for formal memory testing in the form of neuropsychological evaluation and she is been scheduled with Dr. Valentina Shaggy in January 2016. I suggested we start the patient on low-dose Aricept 5 mg once daily. I advised her not to resume driving at this time.   She had a brain MRI without contrast on 12/12/2013: 1. No evidence of acute intracranial abnormality or mass. 2. Mild chronic small vessel ischemic disease and mild cerebral atrophy. In addition I personally reviewed the images  through the PACS system. She had blood work with her PCP on 11/28/2013 which showed a normal CBC with differential, normal CMP, normal thyroid function test, elevated LDL at 123, low normal vitamin B12 at 346. She has had problems with short-term memory over the past year, worse over the past 5-6 months. She got lost driving one time recently when picking up her grandchild. She became confused recently one time while grocery shopping. She is an only child and took care of both her parents. Her father lived to be 52 and  died in 01-13-13 and her mother died 43 years earlier at 29. She has 2 grown daughters and they have 2 GC. 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 She does not smoke or drink EtOH. There is no FHx of dementia.   She primarily has been having difficulty with short-term memory such as forgetfulness, misplacing things, asking the same question again and forgetting dates and events. There is report of rare confusion or disorientation. Familiar faces are easily recognized. She reports no recurrent headaches, but reports one recent mild HA. She denies VH and AH and delusions.   She denies depression. She denies SI/HI. She had a lot of stress when she took care of her parents.   The patient denies prior TIA or stroke symptoms, such as sudden onset of one sided weakness, numbness, tingling, slurring of speech or droopy face, hearing loss, tinnitus, diplopia or visual field cut or monocular loss of vision, and denies recurrent headaches.   Of note, the patient has very mild snoring, and there is no report of witnessed apneas or choking sensations while asleep.    Her Past Medical History Is Significant For: Past Medical History  Diagnosis Date  . Hypertension   . Gout   . Vitamin B deficiency     Her Past Surgical History Is Significant For: No past surgical history on file.  Her Family History Is Significant For: Family History  Problem Relation Age of Onset  . Cancer Mother     skin     Her Social History Is Significant For: Social History   Social History  . Marital Status: Married    Spouse Name: Kristi [name]  . Number of Children: 2  . Years of Education: 16   Occupational History  .      retired for 12 years   Social History Main Topics  . Smoking status: Never Smoker   . Smokeless tobacco: Never Used  . Alcohol Use: No  . Drug Use: No  . Sexual Activity: Not Asked   Other Topics Concern  . None   Social History Narrative   Patient  is right handed and resides with husband    Her Allergies Are:  No Known Allergies:   Her Current Medications Are:  Outpatient Encounter Prescriptions as of 06/06/2015  Medication Sig  . allopurinol (ZYLOPRIM) 300 MG tablet Take 300 mg by mouth daily.  . colchicine 0.6 MG tablet Take 0.6 mg by mouth daily.  . Cyanocobalamin (B-12) 1000 MCG/ML KIT Inject as directed every 30 (thirty) days.  Marland Kitchen donepezil (ARICEPT) 10 MG tablet Take 1 tablet (10 mg total) by mouth at bedtime.  Marland Kitchen losartan (COZAAR) 100 MG tablet Take 100 mg by mouth daily.  Marland Kitchen NAMENDA XR 14 MG CP24 24 hr capsule TAKE ONE CAPSULE BY MOUTH EVERY DAY   No facility-administered encounter medications on file as of 06/06/2015.  :  Review of Systems:  Out of a complete 14 point review of systems, all are reviewed and negative with the exception of these symptoms as listed below:   Review of Systems  Neurological:       Patient reports increase sleepiness throughout the day. She states that she has been going to bed around 9pm and waking up at 3 am.  Patient needs refill of Namenda.     Objective:  Neurologic Exam  Physical Exam Physical Examination:   Filed Vitals:   06/06/15 1031  BP: 142/86  Pulse: 72  Resp: 14    General Examination: The patient is a very pleasant 70 y.o. female in no acute distress. She is calm and cooperative with the exam. She is very well groomed and situated in a chair. She is in good spirits today.  HEENT: Normocephalic, atraumatic, pupils are equal, round and reactive to light and accommodation. Bilateral mild cataracts noted. Funduscopic exam is normal with sharp disc margins noted. Extraocular tracking shows no saccadic breakdown without nystagmus noted. Hearing is intact. Face is symmetric with no facial masking and normal facial sensation. There is no lip, neck or jaw tremor. Neck is not rigid with intact passive ROM. There are no carotid bruits on auscultation. Oropharynx exam reveals mild  mouth dryness. No significant airway crowding is noted. Mallampati is class II. Tongue protrudes centrally and palate elevates symmetrically. Tonsils are absent.   Chest: is clear to auscultation without wheezing, rhonchi or crackles noted.  Heart: sounds are regular and normal without murmurs, rubs or gallops noted.   Abdomen: is soft, non-tender and non-distended with normal bowel sounds appreciated on auscultation.  Extremities: There is no pitting edema in the distal lower extremities bilaterally. Pedal pulses are intact.   Skin: is warm and dry with no trophic changes noted.   Musculoskeletal: exam reveals no obvious joint deformities, tenderness or joint swelling or erythema.   Neurologically:  Mental status: The patient is awake and alert, paying good  attention. She is able to partially provide the history. Her husband provides details. She is oriented to: person, place, situation and year. Her memory, attention, language and knowledge are impaired. There is no aphasia, agnosia, apraxia or anomia. There is a mild degree of bradyphrenia. Speech is mildly hypophonic with no dysarthria noted. Mood is congruent and affect is normal.   On 01/30/14: Her MMSE (Mini-Mental state exam) score is 15/30. CDT (Clock Drawing Test) score is 1/4. AFT (Animal Fluency Test) score is 10.   On 05/01/2014: MMSE: 15/30, CDT 1/4, AFT 8, geriatric depression score is 3 out of 15.   On 07/31/2014: MMSE 18/30, CDT: 2/4, AFT 11, GDS 2/15.  On 01/31/2015: MMSE: 14/30, CDT: 1/4, AFT: 8/min.   On 04/03/15: MMSE: 17/30, AFT: 5/min.  Cranial nerves are as described above under HEENT exam. In addition, shoulder shrug is normal with equal shoulder height noted.  Motor exam: Normal bulk, and strength for age is noted. Tone is not rigid with absence of cogwheeling in the extremities. There is overall no bradykinesia. There is no drift or rebound. There is no tremor.  Romberg is negative. Reflexes are 1+ in the upper  extremities and 1+ in the lower extremities. Toes are downgoing bilaterally. Fine motor skills: Finger taps, hand movements, and rapid alternating patting are preserved, as well as LE fine motor skills.   Cerebellar testing shows no dysmetria or intention tremor. There is no truncal or gait ataxia.   Sensory exam is intact to light touch in the upper  and lower extremities.   Gait, station and balance: She stands up from the seated position with mild difficulty. No veering to one side is noted. No leaning to one side. Posture is age appropriate. Stance is narrow-based. She turns en bloc and gait is normal but she does have mild difficulty with tandem walk.   Assessment and Plan:   In summary, LAAIBAH WARTMAN is a very pleasant 70 year old female with an underlying medical history of hypertension, gout and arthritis, who has had memory loss for the past 2+ years. She has had progressive short-term memory loss of the past year and has a history of getting lost while driving in a familiar neighborhood in the recent past. Her memory scores indicate moderate dementia and numbers are worse today from 6 months ago. She has been on Aricept 10 mg strength and has been on Namenda long-acting 14 mg once daily. She is tolerating this. She has had more depressive symptoms and more irregularity in her sleep. She has early morning awakenings and daytime somnolence. Today, I suggested we start her on low-dose Lexapro for symptomatically treatment of her depression. She and her daughter were in agreement. For now, we will continue Namenda long-acting at the current dose so not to make too many changes at once. She will also continue with generic Aricept 10 mg once daily. I provided refills on the Namenda long-acting as well as a new prescription for generic Lexapro 5 mg once daily and talked to them about potential side effects including sudden exacerbation of depression as well as the rare possibility of having suicidal  ideations. Thankfully, she has help and supervision at the house. Both her daughters are closely involved in the patient's and her husband's care. I suggested a four-month checkup, sooner if needed. I advised them to call or email for any interim questions or concerns. I answered all her questions today and they were in agreement.   I spent 25 minutes in total face-to-face time with the patient, more than 50% of which was spent in counseling and coordination of care, reviewing test results, reviewing medication and discussing or reviewing the diagnosis of dementia, its prognosis and treatment options.

## 2015-06-06 NOTE — Patient Instructions (Addendum)
We will continue with your memory medications. We will start you on low dose lexapro 5 mg once daily in AM for your depression.  Try to drink more water, eat healthy, try to exercise.

## 2015-07-04 ENCOUNTER — Ambulatory Visit: Payer: Medicare Other | Admitting: Nurse Practitioner

## 2015-07-16 ENCOUNTER — Other Ambulatory Visit: Payer: Self-pay

## 2015-07-16 DIAGNOSIS — F0390 Unspecified dementia without behavioral disturbance: Secondary | ICD-10-CM

## 2015-07-16 DIAGNOSIS — F32A Depression, unspecified: Secondary | ICD-10-CM

## 2015-07-16 DIAGNOSIS — F329 Major depressive disorder, single episode, unspecified: Secondary | ICD-10-CM

## 2015-07-16 MED ORDER — ESCITALOPRAM OXALATE 5 MG PO TABS
5.0000 mg | ORAL_TABLET | Freq: Every day | ORAL | Status: DC
Start: 2015-07-16 — End: 2015-10-22

## 2015-10-08 ENCOUNTER — Telehealth: Payer: Self-pay | Admitting: Neurology

## 2015-10-08 NOTE — Telephone Encounter (Signed)
Pt's daughter called to r/s appt from 5/11 to 7/3 with Dr Frances FurbishAthar. She also mentioned that pt PCP and discussed that she was having a new onset of HA's for about 2 weeks ago and then another one this past Sunday. Tylenol did relieve the symptoms. She wants to discuss this at appt 12/02/15

## 2015-10-10 ENCOUNTER — Ambulatory Visit: Payer: Medicare Other | Admitting: Neurology

## 2015-10-22 ENCOUNTER — Other Ambulatory Visit: Payer: Self-pay | Admitting: Neurology

## 2015-11-08 ENCOUNTER — Ambulatory Visit: Payer: Medicare Other | Admitting: Nurse Practitioner

## 2015-12-02 ENCOUNTER — Encounter: Payer: Self-pay | Admitting: Neurology

## 2015-12-02 ENCOUNTER — Ambulatory Visit (INDEPENDENT_AMBULATORY_CARE_PROVIDER_SITE_OTHER): Payer: Medicare Other | Admitting: Neurology

## 2015-12-02 VITALS — BP 150/98 | HR 70 | Resp 16 | Ht 67.0 in | Wt 161.0 lb

## 2015-12-02 DIAGNOSIS — F32A Depression, unspecified: Secondary | ICD-10-CM

## 2015-12-02 DIAGNOSIS — F0390 Unspecified dementia without behavioral disturbance: Secondary | ICD-10-CM | POA: Diagnosis not present

## 2015-12-02 DIAGNOSIS — F329 Major depressive disorder, single episode, unspecified: Secondary | ICD-10-CM

## 2015-12-02 MED ORDER — MEMANTINE HCL ER 21 MG PO CP24: 21.0000 mg | ORAL_CAPSULE | Freq: Every day | ORAL | Status: DC

## 2015-12-02 MED ORDER — ESCITALOPRAM OXALATE 5 MG PO TABS: 5.0000 mg | ORAL_TABLET | Freq: Every day | ORAL | Status: DC

## 2015-12-02 MED ORDER — DONEPEZIL HCL 10 MG PO TABS: 10.0000 mg | ORAL_TABLET | Freq: Every day | ORAL | Status: DC

## 2015-12-02 NOTE — Patient Instructions (Addendum)
I would like to increase your Namenda XR to 21 mg once daily, let's keep the donepezil at 10 mg.  Let's do a 3 month check up with Darrol Angelarolyn Martin to see if you are tolerating the Namenda XR at 21 mg, and consider an increase to 28 mg daily then. Also, we will look at your lexapro and whether we need to increase that as well.  As we mutually agreed today, you will no longer drive.

## 2015-12-02 NOTE — Progress Notes (Signed)
Subjective:    Patient ID: Kristi [name] is a 70 y.o. female.  HPI     Interim history:   Kristi [name] is a 70-year-old right-handed woman with an underlying medical history of hypertension and arthritis/gout, who presents for follow-up consultation of her memory loss. She's accompanied by her daughter and 2 yo GD today. I last saw her on 06/06/2015, at which time she was tolerating generic Aricept 10 mg daily and Namenda long-acting 14 mg daily, reported however interim depressive symptoms and stress what with her husband's illness and longer recovery. She was not sleeping very well because she was listing out for him. Both daughters were helping out. She was having more daytime somnolence. I suggested we start her on low-dose Lexapro generic 5 mg strength for her symptoms of depression. I kept her memory medications the same. She called in the interim in May reporting intermittent headaches which were responding to Tylenol as needed.  Today, 12/02/2015: She reports doing well. She is very active inside the house and outside. She does not drive but is asking whether she could drive shorter distances. We have talked about her driving before. I asked her no longer to drive because even a short distance may not be safe for her as she does not have the same visuospatial skills and reaction time as she would need to be completely safe even for shorter distances. He would also add more stress and she is quite sensitive to stress. Depression-wise, she feels well. She has been taking the low-dose Lexapro. There was an incident recently where she had open the door to a lady who had asked to come in and used her home phone even though this lady had her own phone. This was not completely safe behavior. Thankfully, her husband and his CNA work at the house at the same time and the lady briefly made a phone call and left without thinking her. After that, the family decided that the patient would not answer the  door to  strangers.   Previously:  I saw her on 01/31/2015, at which time she reported having more stress d/t her husband's interim illness and surgery with prolonged recovery. Her daughter reported that she was having difficulty with confusion, orientation, word finding difficulties. She was on B12 injections. Her daughters were helpful and she also had help from neighbors and friends.  Her MMSE was 14/30, CDT: 1/4, AFT: 8/min. I suggested we continue with generic Aricept 10 mg once daily but add Namenda long-acting 7 mg once daily to it. She was seen in the interim by Carolyn Martin, NP on 04/03/2015, at which time Namenda XR was increased to 14 mg once daily. Her MMSE was 17/30 at the time, AFT 5/min.  I saw her on 07/31/2014, at which time she reported doing well. She had been able to tolerate donepezil at 10 mg strength. She had not had any changes in her medications but she was diagnosed with B12 deficiency. This was actually diagnosed by her primary care physician some months or maybe even years ago. She had never been on injections with B12 and was supposed to start them the week after. She had no new complaints. She was driving. She was trying to stay active mentally and physically. She was trying to work out regularly and was trying to eat healthy. Her MMSE was 18/30, CDT 2/4, and AFT was 11 per minute. I did not suggest any new medications, especially in light of her diagnosis of B12 deficiency   and starting B12 injections soon.   I saw her on 05/01/2014, at which time I increased her Aricept from 5 mg to 10 mg. Her MMSE was stable at 15 out of 30 at the time. In the interim, she saw Dr. Zelson on 06/12/2014 and I reviewed his neuropsychological test results and reported in detail. 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  I first met her on 01/30/2014, at which time she reported an approximately 6-12 month history of progressive memory loss. She had had recent  blood work and a brain MRI which we discussed. I referred her for formal memory testing in the form of neuropsychological evaluation and she is been scheduled with Dr. Zelson in January 2016. I suggested we start the patient on low-dose Aricept 5 mg once daily. I advised her not to resume driving at this time.  She had a brain MRI without contrast on 12/12/2013: 1. No evidence of acute intracranial abnormality or mass. 2. Mild chronic small vessel ischemic disease and mild cerebral atrophy. In addition I personally reviewed the images through the PACS system. She had blood work with her PCP on 11/28/2013 which showed a normal CBC with differential, normal CMP, normal thyroid function test, elevated LDL at 123, low normal vitamin B12 at 346. She has had problems with short-term memory over the past year, worse over the past 5-6 months. She got lost driving one time recently when picking up her grandchild. She became confused recently one time while grocery shopping. She is an only child and took care of both her parents. Her father lived to be 90 and died in July 2014 and her mother died 10 years earlier at 78. She has 2 grown daughters and they have 2 GC. 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 She does not smoke or drink EtOH. There is no FHx of dementia.   She primarily has been having difficulty with short-term memory such as forgetfulness, misplacing things, asking the same question again and forgetting dates and events. There is report of rare confusion or disorientation. Familiar faces are easily recognized. She reports no recurrent headaches, but reports one recent mild HA. She denies VH and AH and delusions.   She denies depression. She denies SI/HI. She had a lot of stress when she took care of her parents.   The patient denies prior TIA or stroke symptoms, such as sudden onset of one sided weakness, numbness, tingling, slurring of speech or droopy face,  hearing loss, tinnitus, diplopia or visual field cut or monocular loss of vision, and denies recurrent headaches.   Of note, the patient has very mild snoring, and there is no report of witnessed apneas or choking sensations while asleep.    Her Past Medical History Is Significant For: Past Medical History  Diagnosis Date  . Hypertension   . Gout   . Vitamin B deficiency     Her Past Surgical History Is Significant For: No past surgical history on file.  Her Family History Is Significant For: Family History  Problem Relation Age of Onset  . Cancer Mother     skin     Her Social History Is Significant For: Social History   Social History  . Marital Status: Married    Spouse Name: Otis  . Number of Children: 2  . Years of Education: 16   Occupational History  .      retired for 12 years     Social History Main Topics  . Smoking status: Never Smoker   . Smokeless tobacco: Never Used  . Alcohol Use: No  . Drug Use: No  . Sexual Activity: Not Asked   Other Topics Concern  . None   Social History Narrative   Patient is right handed and resides with husband    Her Allergies Are:  No Known Allergies:   Her Current Medications Are:  Outpatient Encounter Prescriptions as of 12/02/2015  Medication Sig  . allopurinol (ZYLOPRIM) 300 MG tablet Take 300 mg by mouth daily.  . colchicine 0.6 MG tablet Take 0.6 mg by mouth daily.  . Cyanocobalamin (B-12) 1000 MCG/ML KIT Inject as directed every 30 (thirty) days.  . donepezil (ARICEPT) 10 MG tablet Take 1 tablet (10 mg total) by mouth at bedtime.  . escitalopram (LEXAPRO) 5 MG tablet TAKE 1 TABLET (5 MG TOTAL) BY MOUTH DAILY.  . losartan (COZAAR) 100 MG tablet Take 100 mg by mouth daily.  . memantine (NAMENDA XR) 14 MG CP24 24 hr capsule Take 1 capsule (14 mg total) by mouth daily.   No facility-administered encounter medications on file as of 12/02/2015.  :  Review of Systems:  Out of a complete 14 point review of systems,  all are reviewed and negative with the exception of these symptoms as listed below:   Review of Systems  Neurological:       Patient is asking about being able to drive.  No new concerns per patient.     Objective:  Neurologic Exam  Physical Exam Physical Examination:   Filed Vitals:   12/02/15 0854  BP: 150/98  Pulse: 70  Resp: 16   General Examination: The patient is a very pleasant 69 y.o. female in no acute distress. She is calm and cooperative with the exam. She is well groomed and situated in a chair. She is in good spirits today, less anxious.  HEENT: Normocephalic, atraumatic, pupils are equal, round and reactive to light and accommodation. Bilateral mild cataracts noted. Funduscopic exam is normal with sharp disc margins noted. Extraocular tracking shows no saccadic breakdown without nystagmus noted. Hearing is intact. Face is symmetric with no facial masking and normal facial sensation. There is no lip, neck or jaw tremor. Neck is not rigid with intact passive ROM. There are no carotid bruits on auscultation. Oropharynx exam reveals mild mouth dryness. No significant airway crowding is noted. Mallampati is class II. Tongue protrudes centrally and palate elevates symmetrically. Tonsils are absent.   Chest: is clear to auscultation without wheezing, rhonchi or crackles noted.  Heart: sounds are regular and normal without murmurs, rubs or gallops noted.   Abdomen: is soft, non-tender and non-distended with normal bowel sounds appreciated on auscultation.  Extremities: There is no pitting edema in the distal lower extremities bilaterally. Pedal pulses are intact.   Skin: is warm and dry with no trophic changes noted.   Musculoskeletal: exam reveals no obvious joint deformities, tenderness or joint swelling or erythema.   Neurologically:  Mental status: The patient is awake and alert, paying good  attention. She is able to partially provide the history. Her husband provides  details. She is oriented to: person, place, situation and year. Her memory, attention, language and knowledge are impaired. There is no aphasia, agnosia, apraxia or anomia. There is a mild degree of bradyphrenia. Speech is mildly hypophonic with no dysarthria noted. Mood is congruent and affect is normal.   On 01/30/14: Her MMSE (Mini-Mental state exam) score is 15/30.   CDT (Clock Drawing Test) score is 1/4. AFT (Animal Fluency Test) score is 10.   On 05/01/2014: MMSE: 15/30, CDT 1/4, AFT 8, geriatric depression score is 3 out of 15.   On 07/31/2014: MMSE 18/30, CDT: 2/4, AFT 11, GDS 2/15.  On 01/31/2015: MMSE: 14/30, CDT: 1/4, AFT: 8/min.   On 04/03/15: MMSE: 17/30, AFT: 5/min.  On 12/02/2015: MMSE: 14/30, CDT: 1/4, AFT: 8/min.  Cranial nerves are as described above under HEENT exam. In addition, shoulder shrug is normal with equal shoulder height noted.  Motor exam: Normal bulk, and strength for age is noted. Tone is not rigid with absence of cogwheeling in the extremities. There is overall no bradykinesia. There is no drift or rebound. There is no tremor.  Romberg is negative. Reflexes are 1+ in the upper extremities and 1+ in the lower extremities. Toes are downgoing bilaterally. Fine motor skills: Finger taps, hand movements, and rapid alternating patting are preserved, as well as LE fine motor skills.   Cerebellar testing shows no dysmetria or intention tremor. There is no truncal or gait ataxia.   Sensory exam is intact to light touch in the upper and lower extremities.   Gait, station and balance: She stands up from the seated position with mild difficulty. No veering to one side is noted. No leaning to one side. Posture is age appropriate. Stance is narrow-based.   Assessment and Plan:   In summary, Anikah J Cipriani is a very pleasant 69-year old female with an underlying medical history of hypertension, gout and arthritis, who has had memory loss for the past 2 to 3 years. She has had  progressive short-term memory loss of the past year and has a history of getting lost while driving in a familiar neighborhood in the past. Since then, she has not been driving, since 2015. I reiterated today that she should no longer be driving. She is able to accept this and family has also continued to remind her that she should no longer drive. Her memory scores have indicated moderate dementia without any historical evidence of behavioral or personality changes. She has had mild depressive symptoms and is on low-dose Lexapro for this. We will recheck next time if that needs to be increased but for now we will keep it stable at 5 mg daily generic. We will keep her Aricept to 10 mg generic, today I suggested we increase the Namenda XR to 21 mg daily and have her follow up in 3 months with Carolyn Martin, NP, at which time we can consider increasing the Namenda XR to 28 mg daily, if tolerated at 21 mg at the time. We can also revisit the dose for generic Lexapro at the time. Her physical remained stable. She is advised to stay active mentally and physically and stay well-hydrated. Thankfully, she has help and supervision at the house. Both her daughters are closely involved in the patient's and her husband's care.  also, her husband has been doing better which is also reassuring and less stress for her. We will do a 3 month follow-up with nurse practitioner, and I will see her back in 6 months from now. I answered all her questions today and the patient and her daughter were in agreement.  I spent 25 minutes in total face-to-face time with the patient, more than 50% of which was spent in counseling and coordination of care, reviewing test results, reviewing medication and discussing or reviewing the diagnosis of dementia, its prognosis and treatment options.  

## 2015-12-14 ENCOUNTER — Other Ambulatory Visit: Payer: Self-pay | Admitting: Neurology

## 2016-03-09 ENCOUNTER — Encounter: Payer: Self-pay | Admitting: Nurse Practitioner

## 2016-03-09 ENCOUNTER — Ambulatory Visit (INDEPENDENT_AMBULATORY_CARE_PROVIDER_SITE_OTHER): Payer: Medicare Other | Admitting: Nurse Practitioner

## 2016-03-09 VITALS — BP 145/88 | HR 60 | Ht 67.0 in | Wt 159.6 lb

## 2016-03-09 DIAGNOSIS — F0390 Unspecified dementia without behavioral disturbance: Secondary | ICD-10-CM

## 2016-03-09 DIAGNOSIS — F329 Major depressive disorder, single episode, unspecified: Secondary | ICD-10-CM

## 2016-03-09 DIAGNOSIS — F3289 Other specified depressive episodes: Secondary | ICD-10-CM

## 2016-03-09 MED ORDER — MEMANTINE HCL ER 28 MG PO CP24: 28.0000 mg | ORAL_CAPSULE | Freq: Every day | ORAL | 5 refills | Status: DC

## 2016-03-09 NOTE — Progress Notes (Addendum)
 [facility]  PATIENT: Kristi [name] DOB: 03/22/1946   REASON FOR VISIT: Follow-up for dementia HISTORY FROM: patient and daughter Kristi [name]    HISTORY OF PRESENT ILLNESS:UPDATE 10/09/2017CM Kristi [name], 70-year-old female returns for follow-up with her daughter. She has a history of dementia. She is no longer driving she still is independent with her activities of daily living still does some cooking. No safety issues. In terms of her depression, she feels  stable. She denies any recent falls. She is currently on Aricept 10 mg daily and Namenda extended release 21 mg daily. She has not had side effects to either medication. She is also on low-dose Lexapro for depression. She has no new neurologic complaints today she returns for reevaluation   Today, 07/03/2017SA: She reports doing well. She is very active inside the house and outside. She does not drive but is asking whether she could drive shorter distances. We have talked about her driving before. I asked her no longer to drive because even a short distance may not be safe for her as she does not have the same visuospatial skills and reaction time as she would need to be completely safe even for shorter distances. He would also add more stress and she is quite sensitive to stress. Depression-wise, she feels well. She has been taking the low-dose Lexapro. There was an incident recently where she had open the door to a lady who had asked to come in and used her home phone even though this lady had her own phone. This was not completely safe behavior. Thankfully, her husband and his CNA work at the house at the same time and the lady briefly made a phone call and left without thinking her. After that, the family decided that the patient would not answer the door to  strangers.  HISTORY Kristi [name] is a 69-year-old right-handed woman with an underlying medical history of hypertension and arthritis/gout, who presents for  follow-up consultation of her memory loss. She's accompanied by her daughter and 2 yo GD today. I last saw her on 06/06/2015, at which time she was tolerating generic Aricept 10 mg daily and Namenda long-acting 14 mg daily, reported however interim depressive symptoms and stress what with her husband's illness and longer recovery. She was not sleeping very well because she was listing out for him. Both daughters were helping out. She was having more daytime somnolence. I suggested we start her on low-dose Lexapro generic 5 mg strength for her symptoms of depression. I kept her memory medications the same. She called in the interim in May reporting intermittent headaches which were responding to Tylenol as needed.  REVIEW OF SYSTEMS: Full 14 system review of systems performed and notable only for those listed, all others are neg:  Constitutional: neg  Cardiovascular: neg Ear/Nose/Throat: neg  Skin: neg Eyes: neg Respiratory: neg Gastroitestinal: neg  Hematology/Lymphatic: neg  Endocrine: neg Musculoskeletal:neg Allergy/Immunology: neg Neurological: Memory loss , word finding issues Psychiatric: anxiety Sleep : neg   ALLERGIES: No Known Allergies  HOME MEDICATIONS: Outpatient Medications Prior to Visit  Medication Sig Dispense Refill  . allopurinol (ZYLOPRIM) 300 MG tablet Take 300 mg by mouth daily.  12  . colchicine 0.6 MG tablet Take 0.6 mg by mouth daily.  12  . Cyanocobalamin (B-12) 1000 MCG/ML KIT Inject as directed every 30 (thirty) days.    . donepezil (ARICEPT) 10 MG tablet Take 1 tablet (10 mg total) by mouth at bedtime. 30 tablet 5  . escitalopram (  LEXAPRO) 5 MG tablet Take 1 tablet (5 mg total) by mouth daily. 90 tablet 3  . losartan (COZAAR) 100 MG tablet Take 100 mg by mouth daily.    . Memantine HCl ER 21 MG CP24 Take 21 mg by mouth daily. 30 capsule 5   No facility-administered medications prior to visit.     PAST MEDICAL HISTORY: Past Medical History:  Diagnosis  Date  . Gout   . Hypertension   . Vitamin B deficiency     PAST SURGICAL HISTORY: History reviewed. No pertinent surgical history.  FAMILY HISTORY: Family History  Problem Relation Age of Onset  . Cancer Mother     skin     SOCIAL HISTORY: Social History   Social History  . Marital status: Married    Spouse name: [name]  . Number of children: 2  . Years of education: 16   Occupational History  .  Retired    retired for 12 years   Social History Main Topics  . Smoking status: Never Smoker  . Smokeless tobacco: Never Used  . Alcohol use No  . Drug use: No  . Sexual activity: Not on file   Other Topics Concern  . Not on file   Social History Narrative   Patient is right handed and resides with husband     PHYSICAL EXAM  Vitals:   03/09/16 0825  BP: (!) 145/88  Pulse: 60  Weight: 159 lb 9.6 oz (72.4 kg)  Height: 5' 7" (1.702 m)   Body mass index is 25 kg/m.  Generalized: Well developed, in no acute distress , well groomed Head: normocephalic and atraumatic,. Oropharynx benign  Neck: Supple, no carotid bruits  Cardiac: Regular rate rhythm, no murmur  Musculoskeletal: No deformity   Neurological examination   Mentation: Alert not oriented to time, place, provides some history. Attention span and concentration appropriate.  Follows all commands speech and language fluent.  On 01/30/14: Her MMSE (Mini-Mental state exam) score is 15/30. CDT (Clock Drawing Test) score is 1/4. AFT (Animal Fluency Test) score is 10.   On 05/01/2014: MMSE: 15/30, CDT 1/4, AFT 8, geriatric depression score is 3 out of 15.   On 07/31/2014: MMSE 18/30, CDT: 2/4, AFT 11, GDS 2/15.  On 01/31/2015: MMSE: 14/30, CDT: 1/4, AFT: 8/min.   On 04/03/15: MMSE: 17/30, AFT: 5/min.  On 12/02/2015: MMSE: 14/30, CDT: 1/4, AFT: 8/min.  On 03/09/2016 MMSE 13/30. Unable to draw clock AFT 2 in 30 sec Cranial nerve II-XII: Pupils were equal round reactive to light extraocular movements were full,  visual field were full on confrontational test. Facial sensation and strength were normal. hearing was intact to finger rubbing bilaterally. Uvula tongue midline. head turning and shoulder shrug were normal and symmetric.Tongue protrusion into cheek strength was normal. Motor: normal bulk and tone, full strength in the BUE, BLE, fine finger movements normal, no pronator drift. No focal weakness Sensory: normal and symmetric to light touch,  Coordination: finger-nose-finger,  no dysmetria Reflexes: Brachioradialis 2/2, biceps 2/2, triceps 2/2, patellar 2/2, Achilles 2/2, plantar responses were flexor bilaterally. Gait and Station: Rising up from seated position without assistance, narrow based stance,  moderate stride, good arm swing, smooth turning, no assistive device DIAGNOSTIC DATA (LABS, IMAGING, TESTING) - ASSESSMENT AND PLAN  70 y.o. year old female  has a past medical history of Gout; Hypertension; and Vitamin B deficiency and memory loss for 3 years to follow-up. She has had progressive short-term memory loss in the past year and  a half. She is not currently driving and it was reiterated that she should not be driving. She is currently on Namenda 21 mg daily. We will increase 28 mg daily. She will remain on Aricept and Lexapro. No safety issues identified. Living situation appears adequate  PLAN: Increase Namenda to 28 mg extended release daily will refill Continue Aricept at current dose 10 mg daily Continue Lexapro 5 mg daily Follow-up in 3 months with Dr. Athar in January I spent 25 minutes in total face-to-face time with the patient, more than 50% of which was spent in counseling and coordination of care, reviewing test results, reviewing medication and discussing or reviewing the diagnosis of dementia, its prognosis and treatment options.  Carolyn , GNP, BC, APRN  [facility] [address], [address] [place], [place] [address] [phone]  I reviewed  the above note and documentation by the Nurse Practitioner and agree with the history, physical exam, assessment and plan as outlined above. I was immediately available for face-to-face consultation. Saima Athar, MD, PhD [facility] ([facility])   

## 2016-03-09 NOTE — Patient Instructions (Addendum)
Increase Namenda to 28 mg extended release daily refill Continue Aricept at current dose 10 mg daily Continue Lexapro 5 mg daily Follow-up in 3 months with Dr. Frances FurbishAthar in January

## 2016-06-08 ENCOUNTER — Ambulatory Visit (INDEPENDENT_AMBULATORY_CARE_PROVIDER_SITE_OTHER): Payer: Medicare Other | Admitting: Nurse Practitioner

## 2016-06-08 ENCOUNTER — Ambulatory Visit: Payer: Medicare Other | Admitting: Neurology

## 2016-06-08 ENCOUNTER — Encounter: Payer: Self-pay | Admitting: Nurse Practitioner

## 2016-06-08 VITALS — BP 144/91 | HR 62 | Ht 67.0 in | Wt 156.8 lb

## 2016-06-08 DIAGNOSIS — F329 Major depressive disorder, single episode, unspecified: Secondary | ICD-10-CM | POA: Diagnosis not present

## 2016-06-08 DIAGNOSIS — F0280 Dementia in other diseases classified elsewhere without behavioral disturbance: Secondary | ICD-10-CM | POA: Diagnosis not present

## 2016-06-08 DIAGNOSIS — F32A Depression, unspecified: Secondary | ICD-10-CM

## 2016-06-08 DIAGNOSIS — F0390 Unspecified dementia without behavioral disturbance: Secondary | ICD-10-CM

## 2016-06-08 DIAGNOSIS — G309 Alzheimer's disease, unspecified: Secondary | ICD-10-CM

## 2016-06-08 MED ORDER — ESCITALOPRAM OXALATE 5 MG PO TABS: 5.0000 mg | ORAL_TABLET | Freq: Every day | ORAL | 3 refills | Status: DC

## 2016-06-08 MED ORDER — DONEPEZIL HCL 10 MG PO TABS: 10.0000 mg | ORAL_TABLET | Freq: Every day | ORAL | 1 refills | Status: DC

## 2016-06-08 NOTE — Patient Instructions (Signed)
PLAN: Continue  Namenda to 28 mg extended release daily  Continue Aricept at current dose 10 mg daily will refill Continue Lexapro 5 mg daily will refill Follow-up in 4 months with Dr. Frances FurbishAthar

## 2016-06-08 NOTE — Progress Notes (Signed)
[facility]  PATIENT: Kristi [name] DOB: 05/27/1946   REASON FOR VISIT: Follow-up for dementia HISTORY FROM: Patient and  friend    HISTORY OF PRESENT ILLNESS:UPDATE 01/08/2018CM Kristi [name], Kristi [name] returns for follow-up. She has a history of dementia. When last seen 3 months ago Namenda  was increased to 28 mg. She is also on Aricept 10 mg daily along with Lexapro for depression. She no longer drives however remains independent with other activities of daily living. She continues to cook with supervision there have been no safety issues identified. She denies any falls. She returns for reevaluation UPDATE 10/09/2017CM Kristi [name], 71 year old [name] returns for follow-up with her daughter. She has a history of dementia. She is no longer driving she still is independent with her activities of daily living still does some cooking. No safety issues. In terms of her depression, she feels  stable. She denies any recent falls. She is currently on Aricept 10 mg daily and Namenda extended release 21 mg daily. She has not had side effects to either medication. She is also on low-dose Lexapro for depression. She has no new neurologic complaints today she returns for reevaluation   Today, 07/03/2017SA: She reports doing well. She is very active inside the house and outside. She does not drive but is asking whether she could drive shorter distances. We have talked about her driving before. I asked her no longer to drive because even a short distance may not be safe for her as she does not have the same visuospatial skills and reaction time as she would need to be completely safe even for shorter distances. He would also add more stress and she is quite sensitive to stress. Depression-wise, she feels well. She has been taking the low-dose Lexapro. There was an incident recently where she had open the door to a lady who had asked to come in and used her home phone even  though this lady had her own phone. This was not completely safe behavior. Thankfully, her husband and his CNA work at the house at the same time and the lady briefly made a phone call and left without thinking her. After that, the family decided that the patient would not answer the door to strangers. HISTORY Kristi [name] is a 76 year old right-handed woman with an underlying medical history of hypertension and arthritis/gout, who presents for follow-up consultation of her memory loss. She's accompanied by her daughter and 68 yo GD today. I last saw her on 06/06/2015, at which time she was tolerating generic Aricept 10 mg daily and Namenda long-acting 14 mg daily, reported however interim depressive symptoms and stress what with her husband's illness and longer recovery. She was not sleeping very well because she was listing out for him. Both daughters were helping out. She was having more daytime somnolence. I suggested we start her on low-dose Lexapro generic 5 mg strength for her symptoms of depression. I kept her memory medications the same. She called in the interim in May reporting intermittent headaches which were responding to Tylenol as needed.  REVIEW OF SYSTEMS: Full 14 system review of systems performed and notable only for those listed, all others are neg:  Constitutional: neg  Cardiovascular: neg Ear/Nose/Throat: neg  Skin: neg Eyes: neg Respiratory: neg Gastroitestinal: neg  Hematology/Lymphatic: neg  Endocrine: neg Musculoskeletal:neg Allergy/Immunology: neg Neurological: neg Psychiatric: neg Sleep : neg   ALLERGIES: No Known Allergies  HOME MEDICATIONS: Outpatient Medications Prior to Visit  Medication Sig Dispense Refill  .  allopurinol (ZYLOPRIM) 300 MG tablet Take 300 mg by mouth daily.  12  . colchicine 0.6 MG tablet Take 0.6 mg by mouth daily.  12  . Cyanocobalamin (B-12) 1000 MCG/ML KIT Inject as directed every 30 (thirty) days.    Marland Kitchen donepezil (ARICEPT) 10 MG  tablet Take 1 tablet (10 mg total) by mouth at bedtime. 30 tablet 5  . escitalopram (LEXAPRO) 5 MG tablet Take 1 tablet (5 mg total) by mouth daily. 90 tablet 3  . losartan (COZAAR) 100 MG tablet Take 100 mg by mouth daily.    . memantine (NAMENDA XR) 28 MG CP24 24 hr capsule Take 1 capsule (28 mg total) by mouth daily. 30 capsule 5   No facility-administered medications prior to visit.     PAST MEDICAL HISTORY: Past Medical History:  Diagnosis Date  . Gout   . Hypertension   . Vitamin B deficiency     PAST SURGICAL HISTORY: History reviewed. No pertinent surgical history.  FAMILY HISTORY: Family History  Problem Relation Age of Onset  . Cancer Mother     skin     SOCIAL HISTORY: Social History   Social History  . Marital status: Married    Spouse name: [name]  . Number of children: 2  . Years of education: 16   Occupational History  .  Retired    retired for 12 years   Social History Main Topics  . Smoking status: Never Smoker  . Smokeless tobacco: Never Used  . Alcohol use No  . Drug use: No  . Sexual activity: Not on file   Other Topics Concern  . Not on file   Social History Narrative   Patient is right handed and resides with husband     PHYSICAL EXAM  Vitals:   06/08/16 0836  BP: (!) 144/91  Pulse: 62  Weight: 156 lb 12.8 oz (71.1 kg)  Height: '5\' 7"'  (1.702 m)   Body mass index is 24.56 kg/m. Generalized: Well developed, in no acute distress , well groomed Head: normocephalic and atraumatic,. Oropharynx benign  Neck: Supple, no carotid bruits  Cardiac: Regular rate rhythm, no murmur  Musculoskeletal: No deformity   Neurological examination   Mentation: Alert not oriented to time, place, provides some history. Attention span and concentration appropriate.  Follows all commands speech and language fluent.  On 01/30/14: Her MMSE (Mini-Mental state exam) score is 15/30. CDT (Clock Drawing Test) score is 1/4. AFT (Animal Fluency Test) score is  10.   On 05/01/2014: MMSE: 15/30, CDT 1/4, AFT 8, geriatric depression score is 3 out of 15.   On 07/31/2014: MMSE 18/30, CDT: 2/4, AFT 11, GDS 2/15.  On 01/31/2015: MMSE: 14/30, CDT: 1/4, AFT: 8/min.   On 04/03/15: MMSE: 17/30, AFT: 5/min.  On 12/02/2015: MMSE: 14/30, CDT: 1/4, AFT: 8/min.  On 03/09/2016 MMSE 13/30. Unable to draw clock AFT 2 in 30 sec  On 06/08/16 MMSE 12/30. Aft 4 Unable to draw a clock.  Cranial nerve II-XII: Pupils were equal round reactive to light extraocular movements were full, visual field were full on confrontational test. Facial sensation and strength were normal. hearing was intact to finger rubbing bilaterally. Uvula tongue midline. head turning and shoulder shrug were normal and symmetric.Tongue protrusion into cheek strength was normal. Motor: normal bulk and tone, full strength in the BUE, BLE, fine finger movements normal, no pronator drift. No focal weakness Sensory: normal and symmetric to light touch,  Coordination: finger-nose-finger,  no dysmetria Reflexes: Brachioradialis 2/2, biceps  2/2, triceps 2/2, patellar 2/2, Achilles 2/2, plantar responses were flexor bilaterally. Gait and Station: Rising up from seated position without assistance, narrow based stance,  moderate stride, good arm swing, smooth turning, no assistive device  DIAGNOSTIC DATA (LABS, IMAGING, TESTING) - I reviewed patient records, labs, notes, testing and imaging myself where available.   ASSESSMENT AND PLAN 71 y.o. year old [name]  has a past medical history of Gout; Hypertension; and Vitamin B deficiency and memory loss for 3.5  years here to follow-up. She has had progressive short-term memory loss in the past year and a half. She is not currently driving and it was reiterated that she should not be driving. She is currently on Namenda 28 mg daily.  She will remain on Aricept and Lexapro. No safety issues identified. Living situation appears adequate. The patient is a current patient  of Dr. Rexene Alberts. who is out of the office today . This note is sent to the work in doctor.     PLAN:Continue  Namenda to 28 mg extended release daily  Continue Aricept at current dose 10 mg daily will refill Continue Lexapro 5 mg daily will refill Follow-up in 4 months with Dr. Rexene Alberts   II spent 20 minutes in total face-to-face time with the patient, and friend more than 50% of which was spent in counseling and coordination of care, reviewing test results, reviewing medication and discussing or reviewing the diagnosis of dementia, its prognosis and treatment options. Dennie Bible, [facility], [facility], APRN  [facility] [address] [place], [place] [address] [phone]

## 2016-06-21 NOTE — Progress Notes (Signed)
  Personally participated in and made any corrections needed to history, physical, neuro exam,assessment and plan as stated above and agree with plan.   Nillie Bartolotta, MD 

## 2016-08-05 ENCOUNTER — Other Ambulatory Visit: Payer: Self-pay | Admitting: Neurology

## 2016-08-05 DIAGNOSIS — F0390 Unspecified dementia without behavioral disturbance: Secondary | ICD-10-CM

## 2016-09-02 ENCOUNTER — Other Ambulatory Visit: Payer: Self-pay | Admitting: Nurse Practitioner

## 2016-10-06 ENCOUNTER — Encounter: Payer: Self-pay | Admitting: Neurology

## 2016-10-06 ENCOUNTER — Ambulatory Visit (INDEPENDENT_AMBULATORY_CARE_PROVIDER_SITE_OTHER): Payer: Medicare Other | Admitting: Neurology

## 2016-10-06 DIAGNOSIS — F0280 Dementia in other diseases classified elsewhere without behavioral disturbance: Secondary | ICD-10-CM

## 2016-10-06 DIAGNOSIS — G309 Alzheimer's disease, unspecified: Secondary | ICD-10-CM | POA: Diagnosis not present

## 2016-10-06 MED ORDER — MEMANTINE HCL ER 28 MG PO CP24: 28.0000 mg | ORAL_CAPSULE | Freq: Every day | ORAL | 3 refills | Status: DC

## 2016-10-06 MED ORDER — DONEPEZIL HCL 10 MG PO TABS: 10.0000 mg | ORAL_TABLET | Freq: Every day | ORAL | 1 refills | Status: DC

## 2016-10-06 NOTE — Progress Notes (Signed)
Subjective:    Patient ID: Kristi [name] is a 71 y.o. female.  HPI     Interim history:   Kristi [name] is a 71 year old right-handed woman with an underlying medical history of hypertension and arthritis/gout, who presents for follow-up consultation of her memory loss. She's accompanied by her husband and her brother in law today. I last saw her on 12/02/2015, at which time she reported doing well. She try to stay active. She was not driving at the time but requested to drive shorter distances. I did ask her to no longer drive. Mood wise, she felt stable. She was taking low-dose Lexapro. Her MMSE was 14 out of 30 at the time, animal fluency 8. She saw Kristi [name] in the interim on 03/09/2016, at which time her MMSE showed a score of 13, she was unable to draw a clock and AFT was 2 in 30 seconds. She also saw Kristi [name] on 06/08/2016, at which time her MMSE was 12/30, CDT 0/4. She was kept on Namenda XR 28 mg, generic Aricept 10 mg daily and Lexapro 5 mg daily.  Today, 10/06/2016 (all dictated new, as well as above notes, some dictation done in note pad or Word, outside of chart, may appear as copied):  She feels stable, but Hendricks [name], her husband reports that she is more sleepy. Her brother-in-law brings up the issue of driving again as she seems to indicate to her family that she wants to drive. Her family is okay with her not driving but brother-in-law requested I tell her again. There is no family history of dementia per husband. She is an only child. They have 2 daughters. She has not been very active physically, she does not work in the yard. Her husband does not want her to walk outside as she may get lost. She does not exercise otherwise. She does not always drink enough water, she likes to drink sodas. Mood wise, she is doing well. Her husband reports that there is no obvious depression or anxiety. She continues to take low-dose generic Lexapro once daily.  Previously (copied from  previous notes for reference):   I saw her on 06/06/2015, at which time she was tolerating generic Aricept 10 mg daily and Namenda long-acting 14 mg daily, reported however interim depressive symptoms and stress what with her husband's illness and longer recovery. She was not sleeping very well because she was listing out for him. Both daughters were helping out. She was having more daytime somnolence. I suggested we start her on low-dose Lexapro generic 5 mg strength for her symptoms of depression. I kept her memory medications the same. She called in the interim in May reporting intermittent headaches which were responding to Tylenol as needed.   I saw her on 01/31/2015, at which time she reported having more stress d/t her husband's interim illness and surgery with prolonged recovery. Her daughter reported that she was having difficulty with confusion, orientation, word finding difficulties. She was on B12 injections. Her daughters were helpful and she also had help from neighbors and friends.  Her MMSE was 14/30, CDT: 1/4, AFT: 8/min. I suggested we continue with generic Aricept 10 mg once daily but add Namenda long-acting 7 mg once daily to it. She was seen in the interim by Kristi Rubin, NP on 04/03/2015, at which time Namenda XR was increased to 14 mg once daily. Her MMSE was 17/30 at the time, AFT 5/min.   I saw her on 07/31/2014, at which time she reported  doing well. She had been able to tolerate donepezil at 10 mg strength. She had not had any changes in her medications but she was diagnosed with B12 deficiency. This was actually diagnosed by her primary care physician some months or maybe even years ago. She had never been on injections with B12 and was supposed to start them the week after. She had no new complaints. She was driving. She was trying to stay active mentally and physically. She was trying to work out regularly and was trying to eat healthy. Her MMSE was 18/30, CDT 2/4, and AFT was  11 per minute. I did not suggest any new medications, especially in light of her diagnosis of B12 deficiency and starting B12 injections soon.    I saw her on 05/01/2014, at which time I increased her Aricept from 5 mg to 10 mg. Her MMSE was stable at 15 out of 30 at the time. In the interim, she saw Dr. Valentina [name] on 06/12/2014 and I reviewed his neuropsychological test results and reported in detail. 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   I first met her on 01/30/2014, at which time she reported an approximately 6-12 month history of progressive memory loss. She had had recent blood work and a brain MRI which we discussed. I referred her for formal memory testing in the form of neuropsychological evaluation and she is been scheduled with Dr. Valentina [name] in January 2016. I suggested we start the patient on low-dose Aricept 5 mg once daily. I advised her not to resume driving at this time.   She had a brain MRI without contrast on 12/12/2013: 1. No evidence of acute intracranial abnormality or mass. 2. Mild chronic small vessel ischemic disease and mild cerebral atrophy. In addition I personally reviewed the images through the PACS system. She had blood work with her PCP on 11/28/2013 which showed a normal CBC with differential, normal CMP, normal thyroid function test, elevated LDL at 123, low normal vitamin B12 at 346. She has had problems with short-term memory over the past year, worse over the past 5-6 months. She got lost driving one time recently when picking up her grandchild. She became confused recently one time while grocery shopping. She is an only child and took care of both her parents. Her father lived to be 59 and died in 12/27/2012 and her mother died 34 years earlier at 63. She has 2 grown daughters and they have 2 GC. 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 She does not smoke or drink EtOH. There is no FHx of  dementia.   She primarily has been having difficulty with short-term memory such as forgetfulness, misplacing things, asking the same question again and forgetting dates and events. There is report of rare confusion or disorientation. Familiar faces are easily recognized. She reports no recurrent headaches, but reports one recent mild HA. She denies VH and AH and delusions.   She denies depression. She denies SI/HI. She had a lot of stress when she took care of her parents.   The patient denies prior TIA or stroke symptoms, such as sudden onset of one sided weakness, numbness, tingling, slurring of speech or droopy face, hearing loss, tinnitus, diplopia or visual field cut or monocular loss of vision, and denies recurrent headaches.   Of note, the patient has very mild snoring, and there is no report of witnessed apneas or choking sensations while asleep.  Her Past Medical History Is Significant For: Past Medical History:  Diagnosis Date  . Gout   . Hypertension   . Vitamin B deficiency     Her Past Surgical History Is Significant For: No past surgical history on file.  Her Family History Is Significant For: Family History  Problem Relation Age of Onset  . Cancer Mother     skin     Her Social History Is Significant For: Social History   Social History  . Marital status: Married    Spouse name: Hendricks [name]  . Number of children: 2  . Years of education: 16   Occupational History  .  Retired    retired for 12 years   Social History Main Topics  . Smoking status: Never Smoker  . Smokeless tobacco: Never Used  . Alcohol use No  . Drug use: No  . Sexual activity: Not Asked   Other Topics Concern  . None   Social History Narrative   Patient is right handed and resides with husband    Her Allergies Are:  No Known Allergies:   Her Current Medications Are:  Outpatient Encounter Prescriptions as of 10/06/2016  Medication Sig  . allopurinol (ZYLOPRIM) 300 MG tablet Take 300  mg by mouth daily.  . colchicine 0.6 MG tablet Take 0.6 mg by mouth daily.  . Cyanocobalamin (B-12) 1000 MCG/ML KIT Inject as directed every 30 (thirty) days.  Marland Kitchen donepezil (ARICEPT) 10 MG tablet Take 1 tablet (10 mg total) by mouth at bedtime.  Marland Kitchen escitalopram (LEXAPRO) 5 MG tablet Take 1 tablet (5 mg total) by mouth daily.  Marland Kitchen losartan (COZAAR) 100 MG tablet Take 100 mg by mouth daily.  . memantine (NAMENDA XR) 28 MG CP24 24 hr capsule TAKE ONE CAPSULE BY MOUTH EVERY DAY   No facility-administered encounter medications on file as of 10/06/2016.   :  Review of Systems:  Out of a complete 14 point review of systems, all are reviewed and negative with the exception of these symptoms as listed below: Review of Systems  Neurological:       Husband states that patient is having trouble remembering her name at times and how to spell it.   According to patient she does not get out enough.   Family states that one of her medications make her sleepy and ask if anything can be done about it.       Objective:  Neurologic Exam  Physical Exam Physical Examination:   Vitals:   10/06/16 0918  BP: 132/80  Pulse: 78  Resp: 14   General Examination: The patient is a very pleasant 71 y.o. female in no acute distress. She appears well-developed and well-nourished and well groomed. Mildly anxious today.  HEENT: Normocephalic, atraumatic, pupils are equal, round and reactive to light and accommodation. Extraocular tracking shows mild saccadic breakdown without nystagmus noted. Hearing is intact. Face is symmetric with no facial masking and normal facial sensation. There is no lip, neck or jaw tremor. Neck is not rigid with intact passive ROM. There are no carotid bruits on auscultation. Oropharynx exam reveals mild mouth dryness. No significant airway crowding is noted. Mallampati is class II. Tongue protrudes centrally and palate elevates symmetrically. Tonsils are absent.   Chest: is clear to  auscultation without wheezing, rhonchi or crackles noted.  Heart: sounds are regular and normal without murmurs, rubs or gallops noted.   Abdomen: is soft, non-tender and non-distended with normal bowel sounds appreciated on auscultation.  Extremities: There  is no pitting edema in the distal lower extremities bilaterally. Pedal pulses are intact.   Skin: is warm and dry with no trophic changes noted.   Musculoskeletal: exam reveals no obvious joint deformities, tenderness or joint swelling or erythema.   Neurologically: Mental status: The patient is awake and alert, paying good attention. She is able to provide a little of her history. Her husband provides most details. Her brother-in-law chimes in.  She is oriented to: person, place, situation and year. Her memory, attention, language and knowledge are impaired. There is no aphasia, agnosia, apraxia or anomia. There is a mild degree of bradyphrenia. Speech is mildly hypophonic with no dysarthria noted. Mood is congruent and affect is normal.   On 01/30/14: Her MMSE (Mini-Mental state exam) score is 15/30. CDT (Clock Drawing Test) score is 1/4. AFT (Animal Fluency Test) score is 10.   On 05/01/2014: MMSE: 15/30, CDT 1/4, AFT 8, geriatric depression score is 3 out of 15.   On 07/31/2014: MMSE 18/30, CDT: 2/4, AFT 11, GDS 2/15.  On 01/31/2015: MMSE: 14/30, CDT: 1/4, AFT: 8/min.   On 04/03/15: MMSE: 17/30, AFT: 5/min.  On 12/02/2015: MMSE: 14/30, CDT: 1/4, AFT: 8/min.  On 03/09/2016: MMSE: 13/30, unable to draw a clock, AFT was 2 in 30 seconds.  On 06/08/2016: MMSE: 12/30, CDT 0/4.   Cranial nerves are as described above under HEENT exam. In addition, shoulder shrug is normal with equal shoulder height noted.  Motor exam: Normal bulk, and strength for age is noted. Tone is not rigid with absence of cogwheeling in the extremities. There is overall no bradykinesia. There is no drift or rebound. There is no tremor.  Romberg is  negative, except minimal sway. Reflexes are 1+ in the upper extremities and 1+ in the lower extremities. Fine motor skills: stable and preserved.   Cerebellar testing shows no dysmetria or intention tremor. There is no truncal or gait ataxia.   Sensory exam is intact to light touch in the upper and lower extremities.   Gait, station and balance: She stands up from the seated position with mild difficulty, c/o lower back stiffness. No veering to one side is noted. No leaning to one side. Posture is age appropriate. Stance is narrow-based. Walks slowly.   Assessment and Plan:   In summary, SHAQUAYA WUELLNER is a very pleasant 71 year old female with an underlying medical history of hypertension, gout and arthritis, who presents for follow-up consultation of her moderate dementia, likely Alzheimer's disease with symptoms dating back to about 5 years. She has not been driving since 0321. We talked about it again today. She is reminded to no longer drive. She has had no significant mood or behavioral changes. She is on low-dose Lexapro and we will continue. She has had some daytime somnolence. This could be a function of progression of the disease but could be medication side effect as well. To that end, I suggested she take her Aricept and her long-acting Namenda both at night at bedtime. She is reminded to try to exercise more. Furthermore, she is advised to drink more water, less soda. We will continue with generic donepezil 10 mg each bedtime and Namenda long-acting 28 mg each bedtime. I renewed the prescriptions for 90 day supply for this. I suggested a six-month follow-up with one of our nurse practitioners, she has been seeing Hoyle Sauer in the past. I answered all their questions today and the patient and her family were in agreement.  I spent 25 minutes in  total face-to-face time with the patient, more than 50% of which was spent in counseling and coordination of care, reviewing test results,  reviewing medication and discussing or reviewing the diagnosis of OSA, its prognosis and treatment options. Pertinent laboratory and imaging test results that were available during this visit with the patient were reviewed by me and considered in my medical decision making (see chart for details).

## 2016-10-06 NOTE — Patient Instructions (Addendum)
We will switch the Namenda XR to night time as well and see if the daytime sleepiness.  Drink more water.  As discussed, you cannot drive a car any longer.  Please exercise more.

## 2016-12-04 ENCOUNTER — Other Ambulatory Visit: Payer: Self-pay | Admitting: Nurse Practitioner

## 2016-12-04 DIAGNOSIS — G309 Alzheimer's disease, unspecified: Principal | ICD-10-CM

## 2016-12-04 DIAGNOSIS — F0280 Dementia in other diseases classified elsewhere without behavioral disturbance: Secondary | ICD-10-CM

## 2017-03-04 ENCOUNTER — Other Ambulatory Visit: Payer: Self-pay | Admitting: Nurse Practitioner

## 2017-03-04 DIAGNOSIS — G309 Alzheimer's disease, unspecified: Principal | ICD-10-CM

## 2017-03-04 DIAGNOSIS — F0280 Dementia in other diseases classified elsewhere without behavioral disturbance: Secondary | ICD-10-CM

## 2017-04-15 ENCOUNTER — Ambulatory Visit: Payer: Medicare Other | Admitting: Neurology

## 2017-06-02 ENCOUNTER — Encounter: Payer: Self-pay | Admitting: Neurology

## 2017-06-02 ENCOUNTER — Telehealth: Payer: Self-pay | Admitting: Neurology

## 2017-06-02 ENCOUNTER — Ambulatory Visit: Payer: Medicare Other | Admitting: Neurology

## 2017-06-02 VITALS — BP 142/80 | HR 72 | Ht 64.0 in | Wt 160.0 lb

## 2017-06-02 DIAGNOSIS — G309 Alzheimer's disease, unspecified: Secondary | ICD-10-CM | POA: Diagnosis not present

## 2017-06-02 DIAGNOSIS — F329 Major depressive disorder, single episode, unspecified: Secondary | ICD-10-CM | POA: Diagnosis not present

## 2017-06-02 DIAGNOSIS — F0280 Dementia in other diseases classified elsewhere without behavioral disturbance: Secondary | ICD-10-CM | POA: Diagnosis not present

## 2017-06-02 DIAGNOSIS — F32A Depression, unspecified: Secondary | ICD-10-CM

## 2017-06-02 MED ORDER — MEMANTINE HCL ER 28 MG PO CP24: 28.0000 mg | ORAL_CAPSULE | Freq: Every day | ORAL | 3 refills | Status: DC

## 2017-06-02 MED ORDER — ESCITALOPRAM OXALATE 5 MG PO TABS: 5.0000 mg | ORAL_TABLET | Freq: Every day | ORAL | 3 refills | Status: DC

## 2017-06-02 MED ORDER — DONEPEZIL HCL 10 MG PO TABS: 10.0000 mg | ORAL_TABLET | Freq: Every day | ORAL | 3 refills | Status: DC

## 2017-06-02 NOTE — Telephone Encounter (Signed)
I spoke with Dr. Frances FurbishAthar, she recommends that pt continue her two memory meds, no significant changes from last visit, but there was a decline in her memory scores. Dr. Frances FurbishAthar recommends that pt continue pursuing a healthy lifestyle with healthy eating habits, exercising, and hydration.  I called Jillian, pt's daughter, per DPR, and advised her of this information. She verbalized understanding.

## 2017-06-02 NOTE — Telephone Encounter (Signed)
Pts daughter called wanting to follow up with RN or Dr to know if there is any mayjor changes that she would need know

## 2017-06-02 NOTE — Patient Instructions (Addendum)
We will continue with your Lexapro, generic Aricept and long acting Namenda.  Please continue to eat well, drink about 6 glasses or water, try to exercise. We will see you in 6 months.

## 2017-06-02 NOTE — Progress Notes (Signed)
Subjective:    Patient ID: Kristi [name] is a 72 y.o. female.  HPI     Interim history:    Kristi [name] is a 72 year old right-handed woman with an underlying medical history of hypertension and arthritis/gout, who presents for follow-up consultation of her memory loss. She is accompanied by her brother in law, [name], today, her husband could not come today. I last saw her on 10/06/2016, at which time her husband noted that she was more sleepy during the day. We talked about her driving again and she was advised no longer to drive. She had no significant depression or anxiety. She was advised to continue her dual memory medications.   Today, 06/02/2017 (all dictated new, as well as above notes, some dictation done in note pad or Word, outside of chart, may appear as copied):  She reports not having a great appetite sometimes. Weight has been stable. She has help at the house, 4 days a week for a few hours, [name], her husband's brother comes daily, brings lunch and often dinner. Her husband has not driven a car since 1/77. [name] moved back to [place] some 3 years ago. She has 2 daughters in [place], [place] and [place]. Her history is primarily provided by her brother-in-law today. She is unable to provide her history.  The patient's allergies, current medications, family history, past medical history, past social history, past surgical history and problem list were reviewed and updated as appropriate.   Previously (copied from previous notes for reference):   I saw her on 12/02/2015, at which time she reported doing well. She try to stay active. She was not driving at the time but requested to drive shorter distances. I did ask her to no longer drive. Mood wise, she felt stable. She was taking low-dose Lexapro. Her MMSE was 14 out of 30 at the time, animal fluency 8.  She saw Cecille Rubin in the interim on 03/09/2016, at which time her MMSE showed a score of 13, she was unable to draw a clock and AFT  was 2 in 30 seconds.  She saw Cecille Rubin on 06/08/2016, at which time her MMSE was 12/30, CDT 0/4. She was kept on Namenda XR 28 mg, generic Aricept 10 mg daily and Lexapro 5 mg daily.    I saw her on 06/06/2015, at which time she was tolerating generic Aricept 10 mg daily and Namenda long-acting 14 mg daily, reported however interim depressive symptoms and stress what with her husband's illness and longer recovery. She was not sleeping very well because she was listing out for him. Both daughters were helping out. She was having more daytime somnolence. I suggested we start her on low-dose Lexapro generic 5 mg strength for her symptoms of depression. I kept her memory medications the same. She called in the interim in May reporting intermittent headaches which were responding to Tylenol as needed.   I saw her on 01/31/2015, at which time she reported having more stress d/t her husband's interim illness and surgery with prolonged recovery. Her daughter reported that she was having difficulty with confusion, orientation, word finding difficulties. She was on B12 injections. Her daughters were helpful and she also had help from neighbors and friends.  Her MMSE was 14/30, CDT: 1/4, AFT: 8/min. I suggested we continue with generic Aricept 10 mg once daily but add Namenda long-acting 7 mg once daily to it. She was seen in the interim by Cecille Rubin, NP on 04/03/2015, at which time Namenda XR  was increased to 14 mg once daily. Her MMSE was 17/30 at the time, AFT 5/min.   I saw her on 07/31/2014, at which time she reported doing well. She had been able to tolerate donepezil at 10 mg strength. She had not had any changes in her medications but she was diagnosed with B12 deficiency. This was actually diagnosed by her primary care physician some months or maybe even years ago. She had never been on injections with B12 and was supposed to start them the week after. She had no new complaints. She was driving. She  was trying to stay active mentally and physically. She was trying to work out regularly and was trying to eat healthy. Her MMSE was 18/30, CDT 2/4, and AFT was 11 per minute. I did not suggest any new medications, especially in light of her diagnosis of B12 deficiency and starting B12 injections soon.    I saw her on 05/01/2014, at which time I increased her Aricept from 5 mg to 10 mg. Her MMSE was stable at 15 out of 30 at the time. In the interim, she saw Dr. Valentina Shaggy on 06/12/2014 and I reviewed his neuropsychological test results and reported in detail. 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   I first met her on 01/30/2014, at which time she reported an approximately 6-12 month history of progressive memory loss. She had had recent blood work and a brain MRI which we discussed. I referred her for formal memory testing in the form of neuropsychological evaluation and she is been scheduled with Dr. Valentina Shaggy in January 2016. I suggested we start the patient on low-dose Aricept 5 mg once daily. I advised her not to resume driving at this time.   She had a brain MRI without contrast on 12/12/2013: 1. No evidence of acute intracranial abnormality or mass. 2. Mild chronic small vessel ischemic disease and mild cerebral atrophy. In addition I personally reviewed the images through the PACS system. She had blood work with her PCP on 11/28/2013 which showed a normal CBC with differential, normal CMP, normal thyroid function test, elevated LDL at 123, low normal vitamin B12 at 346. She has had problems with short-term memory over the past year, worse over the past 5-6 months. She got lost driving one time recently when picking up her grandchild. She became confused recently one time while grocery shopping. She is an only child and took care of both her parents. Her father lived to be 92 and died in 12-16-2012 and her mother died 110 years earlier at 30. She has 2 grown daughters and  they have 2 GC. 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 She does not smoke or drink EtOH. There is no FHx of dementia.   She primarily has been having difficulty with short-term memory such as forgetfulness, misplacing things, asking the same question again and forgetting dates and events. There is report of rare confusion or disorientation. Familiar faces are easily recognized. She reports no recurrent headaches, but reports one recent mild HA. She denies VH and AH and delusions.   She denies depression. She denies SI/HI. She had a lot of stress when she took care of her parents.   The patient denies prior TIA or stroke symptoms, such as sudden onset of one sided weakness, numbness, tingling, slurring of speech or droopy face, hearing loss, tinnitus, diplopia or visual field cut or monocular loss of vision, and  denies recurrent headaches.   Of note, the patient has very mild snoring, and there is no report of witnessed apneas or choking sensations while asleep.    Her Past Medical History Is Significant For: Past Medical History:  Diagnosis Date  . Gout   . Hypertension   . Vitamin B deficiency     Her Past Surgical History Is Significant For: No past surgical history on file.  Her Family History Is Significant For: Family History  Problem Relation Age of Onset  . Cancer Mother        skin     Her Social History Is Significant For: Social History   Socioeconomic History  . Marital status: Married    Spouse name: [name]  . Number of children: 2  . Years of education: 90  . Highest education level: None  Social Needs  . Financial resource strain: None  . Food insecurity - worry: None  . Food insecurity - inability: None  . Transportation needs - medical: None  . Transportation needs - non-medical: None  Occupational History    Employer: RETIRED    Comment: retired for 12 years  Tobacco Use  . Smoking status: Never Smoker  .  Smokeless tobacco: Never Used  Substance and Sexual Activity  . Alcohol use: No    Alcohol/week: 0.0 oz  . Drug use: No  . Sexual activity: None  Other Topics Concern  . None  Social History Narrative   Patient is right handed and resides with husband    Her Allergies Are:  No Known Allergies:   Her Current Medications Are:  Outpatient Encounter Medications as of 06/02/2017  Medication Sig  . allopurinol (ZYLOPRIM) 300 MG tablet Take 300 mg by mouth daily.  . colchicine 0.6 MG tablet Take 0.6 mg by mouth daily.  . Cyanocobalamin (B-12) 1000 MCG/ML KIT Inject as directed every 30 (thirty) days.  Marland Kitchen donepezil (ARICEPT) 10 MG tablet TAKE 1 TABLET (10 MG TOTAL) BY MOUTH AT BEDTIME.  Marland Kitchen escitalopram (LEXAPRO) 5 MG tablet Take 1 tablet (5 mg total) by mouth daily.  Marland Kitchen losartan (COZAAR) 100 MG tablet Take 100 mg by mouth daily.  . memantine (NAMENDA XR) 28 MG CP24 24 hr capsule Take 1 capsule (28 mg total) by mouth at bedtime.   No facility-administered encounter medications on file as of 06/02/2017.   :  Review of Systems:  Out of a complete 14 point review of systems, all are reviewed and negative with the exception of these symptoms as listed below:  Review of Systems  Neurological:       Pt presents today to discuss her memory. Pt says that she has not been eating as much. Pt is taking namenda and aricept.    Objective:  Neurological Exam  Physical Exam Physical Examination:   Vitals:   06/02/17 1308  BP: (!) 142/80  Pulse: 72   General Examination: The patient is a very pleasant 72 y.o. female in no acute distress. She appears well-developed and well-nourished and well groomed.   HEENT:Normocephalic, atraumatic, pupils are equal, round and reactive to light and accommodation. Extraocular tracking shows mild saccadic breakdown without nystagmus noted. Hearing is intact. Face is symmetric with no facial masking and normal facial sensation. There is no lip, neck or jaw tremor.  Neck is not rigid with intact passive ROM. Speech is scanned today. Oropharynx exam reveals mild mouth dryness. No significant airway crowding is noted. Mallampati is class II. Tongue protrudes centrally and palate elevates  symmetrically. Tonsils are absent.   Chest:is clear to auscultation without wheezing, rhonchi or crackles noted.  Heart:sounds are regular and normal without murmurs, rubs or gallops noted.   Abdomen:is soft, non-tender and non-distended with normal bowel sounds appreciated on auscultation.  Extremities:There is no pitting edema in the distal lower extremities bilaterally. Pedal pulses are intact.   Skin: is warm and dry with no trophic changes noted.   Musculoskeletal: exam reveals no obvious joint deformities, tenderness or joint swelling or erythema.   Neurologically: Mental status: The patient is awake and alert, paying good attention. She is able to provide a little of her history. Her husband provides most details. Her brother-in-law chimes in.  She is oriented to: person, city and county. Her memory, attention, language and knowledge are more impaired. There is no aphasia, agnosia, apraxia or anomia. There is a mild degree of bradyphrenia. Speech is scant. Mood is congruent and affect is normal.   On 01/30/14: Her MMSE (Mini-Mental state exam) score is 15/30. CDT (Clock Drawing Test) score is 1/4. AFT (Animal Fluency Test) score is 10.   On 05/01/2014: MMSE: 15/30, CDT 1/4, AFT 8, geriatric depression score is 3 out of 15.   On 07/31/2014: MMSE 18/30, CDT: 2/4, AFT 11, GDS 2/15.  On 01/31/2015: MMSE: 14/30, CDT: 1/4, AFT: 8/min.   On 04/03/15: MMSE: 17/30, AFT: 5/min.  On 12/02/2015: MMSE: 14/30, CDT: 1/4, AFT: 8/min.  On 03/09/2016: MMSE: 13/30, unable to draw a clock, AFT was 2 in 30 seconds.  On 06/08/2016: MMSE: 12/30, CDT 0/4.   On 06/02/2017: MMSE: 4/30, CDT: 1/4, AFT: 2/min.  Cranial nerves are as described above under HEENT exam. In  addition, shoulder shrug is normal with equal shoulder height noted.  Motor exam: Normal bulk, and strength for age is noted. Tone is not rigid with absence of cogwheeling in the extremities. There is overall no bradykinesia. There is no drift or rebound. There is no tremor.  Reflexes are 1+ in the upper extremities and 1+ in the lower extremities. Fine motor skills: stable and preserved.   Cerebellar testing shows no dysmetria or intention tremor. There is no truncal or gait ataxia.   Sensory exam is intact to light touch in the upper and lower extremities.   Gait, station and balance: She stands up from the seated position with no significant difficulty. No veering to one side is noted. No leaning to one side. Posture is age appropriate. Stance is narrow-based. Walks slowly.   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 She has not been driving since 0511. Her memory scores have taken a more significant decline in this past year. She has no evidence of significant mood or behavioral changes. She has been on low-dose Lexapro. She maintains on dual therapy with long-acting Namenda and generic Aricept. She has had some daytime somnolence. This could be a function of progression of the disease but could be medication side effect as well.  Thankfully, her brother-in-law, [name], has been very closely involved in her care. As I understand, her husband has had a more physical decline in the past year or 2. She is encouraged to try to eat well and drink enough water, exercise regularly possible. I renewed her prescriptions for donepezil and long-acting Namenda  as well as Lexapro for 90 day prescriptions with refills. I suggested a six-month  follow-up with one well-nourished practitioners. I answered all her questions today and the patient and her brother-in-law were in agreement. I spent 20 minutes in total face-to-face time with the patient, more than 50% of which was spent in counseling and coordination of care, reviewing test results, reviewing medication and discussing or reviewing the diagnosis of dementia, its prognosis and treatment options. Pertinent laboratory and imaging test results that were available during this visit with the patient were reviewed by me and considered in my medical decision making (see chart for details).

## 2017-10-12 ENCOUNTER — Telehealth: Payer: Self-pay | Admitting: Neurology

## 2017-10-12 NOTE — Telephone Encounter (Signed)
Pt's daughter called she spoke with PCP today and wants to know if the pt is still taking escitalopram (LEXAPRO) 5 MG tablet.  She also said there has been changes over the past 2 weeks she is wanting to discuss also. Please call to advise

## 2017-10-12 NOTE — Telephone Encounter (Signed)
I called pt's daughter, Kristi [name], per DPR, had an extended conversation with her, greater than 12 minutes.  Pt's daughter reports that pt saw Dr. Neva Seat, PCP, yesterday and it was unclear whether pt was supposed to still be taking lexapro. I advised Jillian that pt's lexapro was refilled on 06/02/17 by our office and sent to [facility] on [address]. Kristi [name] does not think that pt has been taking the lexapro. I encouraged her to call [facility] to check on the status of this RX.  Pt's daughter reports a "steep decline" over the past 2 weeks with the pt. Pt's daughter is unsure if pt has been taking the donepezil or memantine as prescribed. Pt's daughter reports that she found pt's "night meds" untouched recently. Since Thursday, pt's daughter has been involved in making sure that pt takes her meds as prescribed. Pt's daughter wants to know if we should titrate the donepezil and memantine back up to the prescribed dose? However, pt's daughter is unsure if pt has only missed one or two doses of her meds, or if this has been more of a chronic problem. Pt's husband has tried to make sure that pt is taking her meds correctly but pt's husband has health problems of his own that makes it harder for him to assist pt. Pt's daughter wants to know if pt will experience side effects from the namenda and aricept as well.  I recommended a sooner follow up than July with a NP due to pt's decline and pt's daughter's multiple questions regarding medications. Pt's daughter is agreeable to this, and an appt was made with Eber Jones, NP on 10/14/17 at 1:15pm, check in 12:45pm.  Pt's daughter is asking whether to continue giving pt the namenda and aricept, and if it is ok for pt to start taking lexapro again. I advised her that I will check with Dr. Frances Furbish, and if it is NOT ok for pt to resume taking these meds today, then I will call pt's daughter back. Pt's daughter verbalized understanding.

## 2017-10-12 NOTE — Telephone Encounter (Signed)
Please advise daughter to have pt continue all Rx as previously prescribed including lexapro 5 mg, Namenda XR 28 mg daily and donepezil 10 mg each evening.

## 2017-10-12 NOTE — Telephone Encounter (Signed)
I called pt's daughter, Kristi [name], per DPR, and advised her that Dr. Frances Furbish recommends that pt continue lexapro, namenda, and aricept in the evening as prescribed. Pt's daughter verbalized understanding and still wants pt to be evaluated by Eber Jones, NP on Thursday.
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
## 2017-10-14 ENCOUNTER — Encounter: Payer: Self-pay | Admitting: Nurse Practitioner

## 2017-10-14 ENCOUNTER — Ambulatory Visit: Payer: Medicare Other | Admitting: Nurse Practitioner

## 2017-10-14 VITALS — BP 120/70 | HR 74 | Ht 64.0 in | Wt 153.2 lb

## 2017-10-14 DIAGNOSIS — F3289 Other specified depressive episodes: Secondary | ICD-10-CM

## 2017-10-14 DIAGNOSIS — F0390 Unspecified dementia without behavioral disturbance: Secondary | ICD-10-CM

## 2017-10-14 DIAGNOSIS — F329 Major depressive disorder, single episode, unspecified: Secondary | ICD-10-CM | POA: Diagnosis not present

## 2017-10-14 NOTE — Patient Instructions (Signed)
Continue  Namenda to 28 mg extended release daily  Continue Aricept at current dose 10 mg daily will refill Continue Lexapro 5 mg daily will refill Follow-upas planned  with Dr. Frances Furbish

## 2017-10-26 ENCOUNTER — Telehealth: Payer: Self-pay | Admitting: Nurse Practitioner

## 2017-10-26 NOTE — Telephone Encounter (Signed)
Pts daughter Haywood Lasso requesting a call back to discuss the medication the pt had received for Shingles. Pt wanting to make sure there is not s/e or complications with other medications. Please call to advise [phone]

## 2017-10-26 NOTE — Telephone Encounter (Signed)
Spoke with daughter, Haywood Lasso and advised her that the prescribing provider of shingles medication and the patient's pharmacist are the sources to provide her with any information if there is a contraindication with patient's current medications. Lynette verbalized understanding, stated she would "take care of that".

## 2017-11-08 ENCOUNTER — Telehealth: Payer: Self-pay | Admitting: Nurse Practitioner
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
## 2017-11-08 NOTE — Telephone Encounter (Signed)
Patient's daughter Lynette(on DPR) states for the last week patient has been having hand tremors and hallucinating. Could hallucinations be coming from medication escitalopram (LEXAPRO) 5 MG tablet? Please call and discuss.

## 2017-11-09 NOTE — Telephone Encounter (Signed)
I LMVM for pts daughter, Haywood LassoLynette as per CM/NP note.  I asked her to call back re: message at 1230.

## 2017-11-09 NOTE — Telephone Encounter (Signed)
Tremors are not a side effect listed for Lexapro, more likely this is coming from her progressive memory disorder which is in the severe stage at this point looking at her MMSE over time. I discussed with Dr. Frances FurbishAthar. We could check a MRI of the brain and compare to 2015 but given the patients mental state and ability to follow commands they probably would not be able to complete the test. She would have to lay still for 30 to 45 min without moving. Please keep follow upup appt with Dt Athar next month.

## 2017-11-10 NOTE — Telephone Encounter (Signed)
Spoke to daughter, Josephine IgoLynnette and relayed the information per below.  Will hold off now for any additional testing.  Pt is better as she has not heard anything more from other family.  She was told she can call anytime for concerns.  She verbalized understanding.

## 2017-11-18 ENCOUNTER — Telehealth: Payer: Self-pay

## 2017-11-18 NOTE — Telephone Encounter (Signed)
[facility] for [facility] completed and signed by Dr. Frances FurbishAthar. Returned to MR.

## 2017-11-23 ENCOUNTER — Ambulatory Visit: Payer: Medicare Other | Admitting: Adult Health

## 2017-12-06 ENCOUNTER — Telehealth: Payer: Self-pay

## 2017-12-06 NOTE — Telephone Encounter (Signed)
I called Haywood LassoLynette, pt's daughter, per DPR. I advised her that Dr. Frances FurbishAthar will be out of the office the afternoon of 12/16/17 and pt's appt will need to be rescheduled. Pt's daughter is agreeable to an appt on 12/09/17 at 9:30am, check in at 9:00am. Pt's daughter verbalized understanding of new appt date and time.

## 2017-12-09 ENCOUNTER — Encounter: Payer: Self-pay | Admitting: Neurology

## 2017-12-09 ENCOUNTER — Ambulatory Visit: Payer: Medicare Other | Admitting: Neurology

## 2017-12-09 VITALS — BP 155/85 | HR 64 | Ht 70.0 in | Wt 153.0 lb

## 2017-12-09 DIAGNOSIS — F0280 Dementia in other diseases classified elsewhere without behavioral disturbance: Secondary | ICD-10-CM

## 2017-12-09 DIAGNOSIS — G309 Alzheimer's disease, unspecified: Secondary | ICD-10-CM

## 2017-12-09 NOTE — Progress Notes (Signed)
Subjective:    Patient ID: Kristi [name] is a 72 y.o. female.  HPI     Interim history:   Kristi [name] is a 72 year old right-handed woman with an underlying medical history of hypertension and arthritis/gout, who presents for follow-up consultation of her progressive dementia. She is accompanied by her husband, one of her daughters, and her brother in law, Kristi [name], today. I last saw her on 06/02/2017, which time she reported some loss of appetite. Her brother-in-law was helping out as much as he could. Her husband was also having his own medical issues. I suggested we continue with her medications for her memory loss  She was seen by Cecille Rubin, NP, in the interim 10/14/2017, at which time her MMSE was 5 out of 30. Clock drawing was 0/0 and animal fluency was 1/m.  Today, 12/09/2017: She reports very little on her own. She is conversing well but has significant difficulty finding her words and explaining what she is trying to say. Her other daughter, Kristi [name], has questioned whether she has hearing loss. Her husband wonders about it too but Kristi [name], who is here today, does not notice hearing loss as much as may be difficulty processing what she hears. There is no obvious anxiety or depression, she had some milder visual hallucinations in the recent past, sometimes something she watches on TV may seem real to her. Appetite is generally okay, but she eats more snacks such as potato chips, popcorn, chocolate bars, wt is stable.   The patient's allergies, current medications, family history, past medical history, past social history, past surgical history and problem list were reviewed and updated as appropriate.    Previously (copied from previous notes for reference):   I saw her on 10/06/2016, at which time her husband noted that she was more sleepy during the day. We talked about her driving again and she was advised no longer to drive. She had no significant depression or anxiety. She was advised  to continue her dual memory medications.    I saw her on 12/02/2015, at which time she reported doing well. She try to stay active. She was not driving at the time but requested to drive shorter distances. I did ask her to no longer drive. Mood wise, she felt stable. She was taking low-dose Lexapro. Her MMSE was 14 out of 30 at the time, animal fluency 8.   She saw Cecille Rubin in the interim on 03/09/2016, at which time her MMSE showed a score of 13, she was unable to draw a clock and AFT was 2 in 30 seconds.   She saw Cecille Rubin on 06/08/2016, at which time her MMSE was 12/30, CDT 0/4. She was kept on Namenda XR 28 mg, generic Aricept 10 mg daily and Lexapro 5 mg daily.   I saw her on 06/06/2015, at which time she was tolerating generic Aricept 10 mg daily and Namenda long-acting 14 mg daily, reported however interim depressive symptoms and stress what with her husband's illness and longer recovery. She was not sleeping very well because she was listing out for him. Both daughters were helping out. She was having more daytime somnolence. I suggested we start her on low-dose Lexapro generic 5 mg strength for her symptoms of depression. I kept her memory medications the same. She called in the interim in May reporting intermittent headaches which were responding to Tylenol as needed.   I saw her on 01/31/2015, at which time she reported having more stress d/t her husband's  interim illness and surgery with prolonged recovery. Her daughter reported that she was having difficulty with confusion, orientation, word finding difficulties. She was on B12 injections. Her daughters were helpful and she also had help from neighbors and friends.  Her MMSE was 14/30, CDT: 1/4, AFT: 8/min. I suggested we continue with generic Aricept 10 mg once daily but add Namenda long-acting 7 mg once daily to it. She was seen in the interim by Cecille Rubin, NP on 04/03/2015, at which time Namenda XR was increased to 14 mg  once daily. Her MMSE was 17/30 at the time, AFT 5/min.   I saw her on 07/31/2014, at which time she reported doing well. She had been able to tolerate donepezil at 10 mg strength. She had not had any changes in her medications but she was diagnosed with B12 deficiency. This was actually diagnosed by her primary care physician some months or maybe even years ago. She had never been on injections with B12 and was supposed to start them the week after. She had no new complaints. She was driving. She was trying to stay active mentally and physically. She was trying to work out regularly and was trying to eat healthy. Her MMSE was 18/30, CDT 2/4, and AFT was 11 per minute. I did not suggest any new medications, especially in light of her diagnosis of B12 deficiency and starting B12 injections soon.    I saw her on 05/01/2014, at which time I increased her Aricept from 5 mg to 10 mg. Her MMSE was stable at 15 out of 30 at the time. In the interim, she saw Dr. Valentina Shaggy on 06/12/2014 and I reviewed his neuropsychological test results and reported in detail. 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   I first met her on 01/30/2014, at which time she reported an approximately 6-12 month history of progressive memory loss. She had had recent blood work and a brain MRI which we discussed. I referred her for formal memory testing in the form of neuropsychological evaluation and she is been scheduled with Dr. Valentina Shaggy in January 2016. I suggested we start the patient on low-dose Aricept 5 mg once daily. I advised her not to resume driving at this time.   She had a brain MRI without contrast on 12/12/2013: 1. No evidence of acute intracranial abnormality or mass. 2. Mild chronic small vessel ischemic disease and mild cerebral atrophy. In addition I personally reviewed the images through the PACS system. She had blood work with her PCP on 11/28/2013 which showed a normal CBC with differential,  normal CMP, normal thyroid function test, elevated LDL at 123, low normal vitamin B12 at 346. She has had problems with short-term memory over the past year, worse over the past 5-6 months. She got lost driving one time recently when picking up her grandchild. She became confused recently one time while grocery shopping. She is an only child and took care of both her parents. Her father lived to be 40 and died in 01/02/2013 and her mother died 14 years earlier at 39. She has 2 grown daughters and they have 2 GC. 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 She does not smoke or drink EtOH. There is no FHx of dementia.   She primarily has been having difficulty with short-term memory such as forgetfulness, misplacing things, asking the same question again and forgetting dates and events. There is report of rare confusion  or disorientation. Familiar faces are easily recognized. She reports no recurrent headaches, but reports one recent mild HA. She denies VH and AH and delusions.   She denies depression. She denies SI/HI. She had a lot of stress when she took care of her parents.   The patient denies prior TIA or stroke symptoms, such as sudden onset of one sided weakness, numbness, tingling, slurring of speech or droopy face, hearing loss, tinnitus, diplopia or visual field cut or monocular loss of vision, and denies recurrent headaches.   Of note, the patient has very mild snoring, and there is no report of witnessed apneas or choking sensations while asleep.    Her Past Medical History Is Significant For: Past Medical History:  Diagnosis Date  . Gout   . Hypertension   . Vitamin B deficiency     Her Past Surgical History Is Significant For: No past surgical history on file.  Her Family History Is Significant For: Family History  Problem Relation Age of Onset  . Cancer Mother        skin     Her Social History Is Significant For: Social History    Socioeconomic History  . Marital status: Married    Spouse name: [name]  . Number of children: 2  . Years of education: 72  . Highest education level: Not on file  Occupational History    Employer: RETIRED    Comment: retired for 12 years  Social Needs  . Financial resource strain: Not on file  . Food insecurity:    Worry: Not on file    Inability: Not on file  . Transportation needs:    Medical: Not on file    Non-medical: Not on file  Tobacco Use  . Smoking status: Never Smoker  . Smokeless tobacco: Never Used  Substance and Sexual Activity  . Alcohol use: No    Alcohol/week: 0.0 oz  . Drug use: No  . Sexual activity: Not on file  Lifestyle  . Physical activity:    Days per week: Not on file    Minutes per session: Not on file  . Stress: Not on file  Relationships  . Social connections:    Talks on phone: Not on file    Gets together: Not on file    Attends religious service: Not on file    Active member of club or organization: Not on file    Attends meetings of clubs or organizations: Not on file    Relationship status: Not on file  Other Topics Concern  . Not on file  Social History Narrative   Patient is right handed and resides with husband    Her Allergies Are:  No Known Allergies:   Her Current Medications Are:  Outpatient Encounter Medications as of 12/09/2017  Medication Sig  . allopurinol (ZYLOPRIM) 300 MG tablet Take 300 mg by mouth daily.  . colchicine 0.6 MG tablet Take 0.6 mg by mouth daily.  . Cyanocobalamin (B-12) 1000 MCG/ML KIT Inject as directed every 30 (thirty) days.  Marland Kitchen donepezil (ARICEPT) 10 MG tablet Take 1 tablet (10 mg total) by mouth at bedtime.  Marland Kitchen escitalopram (LEXAPRO) 5 MG tablet Take 1 tablet (5 mg total) by mouth daily.  Marland Kitchen losartan (COZAAR) 100 MG tablet Take 100 mg by mouth daily.  . memantine (NAMENDA XR) 28 MG CP24 24 hr capsule Take 1 capsule (28 mg total) by mouth at bedtime.   No facility-administered encounter  medications on file as of 12/09/2017.   :  Review of Systems:  Out of a complete 14 point review of systems, all are reviewed and negative with the exception of these symptoms as listed below: Review of Systems  Neurological:       Pt with family, here following up for the patient's dementia. Daughter states there has been some minor changes since last visit. They questioned if the patient was having some hearing loss.    Objective:  Neurological Exam  Physical Exam Physical Examination:   Vitals:   12/09/17 0904  BP: (!) 155/85  Pulse: 64    General Examination: The patient is a very pleasant 72 y.o. female in no acute distress. She appears well-developed and well-nourished and well groomed. Good spirits.   HEENT:Normocephalic, atraumatic, pupils are equal, round and reactive to light and accommodation. Extraocular tracking showsmild saccadic breakdown without nystagmus noted. Hearing is grossly intact. Face is symmetric with no facial masking and normal facial sensation. There is no lip, neck or jaw tremor. Neck is not rigid with intact passive ROM. Speech is scanned today. Oropharynx exam reveals mild mouth dryness. No significant airway crowding is noted. Mallampati is class II. Tongue protrudes centrally and palate elevates symmetrically.  Chest:is clear to auscultation without wheezing, rhonchi or crackles noted.  Heart:sounds are regular and normal without murmurs, rubs or gallops noted.   Abdomen:is soft, non-tender and non-distended with normal bowel sounds appreciated on auscultation.  Extremities:There is no pitting edema in the distal lower extremities bilaterally.   Skin: is warm and dry with no trophic changes noted.   Musculoskeletal: exam reveals no obvious joint deformities, tenderness or joint swelling or erythema.   Neurologically: Mental status: The patient is awake and alert, paying goodattention. She is able toprovidevery little of  herhistory. Her daughter and husband providedmostHx, brother-in-law chimes in. She is oriented to: person, city and county. Her memory, attention, language and knowledge are impaired. There is significant word finding difficulty.   On 01/30/14: Her MMSE (Mini-Mental state exam) score is 15/30. CDT (Clock Drawing Test) score is 1/4. AFT (Animal Fluency Test) score is 10.   On 05/01/2014: MMSE: 15/30, CDT 1/4, AFT 8, geriatric depression score is 3 out of 15.   On 07/31/2014: MMSE 18/30, CDT: 2/4, AFT 11, GDS 2/15.  On 01/31/2015: MMSE: 14/30, CDT: 1/4, AFT: 8/min.   On 04/03/15: MMSE: 17/30, AFT: 5/min.  On 12/02/2015: MMSE: 14/30, CDT: 1/4, AFT: 8/min.  On10/01/2016:MMSE:13/30,unable to draw a clock,AFT was 2 in 30 seconds.  On1/12/2016:MMSE:12/30, CDT 0/4.   On 06/02/2017: MMSE: 4/30, CDT: 1/4, AFT: 2/min.  On 10/14/17: MMSE: 5/30, CDT: 0/4, AFT: 1/min.  Cranial nerves are as described above under HEENT exam. In addition, shoulder shrug is normal with equal shoulder height noted.  Motor exam: Normal bulk, and strength for age is noted. Tone is not rigid with absence of cogwheeling in the extremities. There is overall no bradykinesia. There is no drift or rebound. There is no tremor.  Reflexes are 1+ in the upper extremities and 1+ in the lower extremities. Fine motor skills:stable andpreserved.   Cerebellar testing shows no dysmetria or intention tremor. There is no truncal or gait ataxia.   Sensory exam is intact to light touch in the upper and lower extremities.   Gait, station and balance: She stands up from the seated position with no significant difficulty. No veering to one side is noted. No leaning to one side. Posture is age appropriate. Stance is narrow-based.Walks slowly.Holds daughter's hand.  Assessment and Plan:   In  summary, MECHELL GIRGIS is a very pleasant33 year old female with an underlying medical history of hypertension, gout and  arthritis, whopresents for follow-up consultation of her advanced dementia, likely Alzheimer's disease with symptoms dating back to about 6 years ago, with unfortunately fairly rapid progression in the past 1 1/2 years. She has not been driving since 9539. Her memory scores have taken a more significant decline in this past year. She has no evidence of significant mood or behavioral changes. She has been on low-dose Lexapro for anxiety. She maintains on dual therapy with long-acting Namenda and generic Aricept. She has had some daytime somnolence, some hallucinations, mild paranoia maybe when watching TV. This could be a function of progression of the disease but could be medication side effect as well.  Thankfully, her her family is extremely supportive and involved which includes her 2 daughters, her husband course and her brother-in-law, husband's brother. Unfortunately, her husband has had his own medical challenges in the past couple of years. We mutually agreed to keep her medication regimen the same. I talked to him about potentially referring her to a more comprehensive dementia or memory loss center such as at one of the academic Medical Center's. They are encouraged to think about it. We may also have some research participation opportunity at our facility. I will talk to the research coordinators about it. We also talked about the importance of healthy lifestyle in physical activity. I suggested a 6 month follow-up, she did not require a refill. I had an extended conversation today with the patient and her family about the challenges of advanced dementia. I answered all their questions today and they were in agreement with the plan. I spent 35 minutes in total face-to-face time with the patient, more than 50% of which was spent in counseling and coordination of care, reviewing test results, reviewing medication and discussing or reviewing the diagnosis of dementia, its prognosis and treatment options.  Pertinent laboratory and imaging test results that were available during this visit with the patient were reviewed by me and considered in my medical decision making (see chart for details).

## 2017-12-09 NOTE — Patient Instructions (Addendum)
We will continue with your 2 medications for your memory.   We will consider a referral to an academic, more comprehensive Dementia and Memory loss center.   You may be a candidate for a research study for dementia.   Please try to eat health and drink about 6 cups of water.

## 2017-12-16 ENCOUNTER — Encounter

## 2017-12-16 ENCOUNTER — Ambulatory Visit: Payer: Medicare Other | Admitting: Neurology

## 2018-01-02 ENCOUNTER — Other Ambulatory Visit: Payer: Self-pay | Admitting: Neurology

## 2018-01-02 DIAGNOSIS — F0280 Dementia in other diseases classified elsewhere without behavioral disturbance: Secondary | ICD-10-CM

## 2018-01-02 DIAGNOSIS — F32A Depression, unspecified: Secondary | ICD-10-CM

## 2018-01-02 DIAGNOSIS — F329 Major depressive disorder, single episode, unspecified: Secondary | ICD-10-CM

## 2018-01-02 DIAGNOSIS — G309 Alzheimer's disease, unspecified: Principal | ICD-10-CM

## 2018-04-04 ENCOUNTER — Telehealth: Payer: Self-pay | Admitting: Neurology

## 2018-04-04 NOTE — Telephone Encounter (Signed)
Patients daughter called and requested to speak with the nurse regarding the patients approval for veteran spousal benefits. Dr. Frances Furbish filled out a form regarding this, and now they are requesting additional information. I offered to have our medical records department send the records but she does not have any of their contact information at the [place]. She says we need to use the ebenefits website. After looking into the requirements further, she stated that her dad would have to send the information in. She is requesting we get together all of the patients records and they will come pick them up.

## 2018-05-10 ENCOUNTER — Telehealth: Payer: Self-pay | Admitting: Neurology

## 2018-05-10 DIAGNOSIS — F0280 Dementia in other diseases classified elsewhere without behavioral disturbance: Secondary | ICD-10-CM

## 2018-05-10 DIAGNOSIS — G309 Alzheimer's disease, unspecified: Principal | ICD-10-CM

## 2018-05-10 NOTE — Telephone Encounter (Signed)
I called pt's daughter Kristi [name], per DPR. She is asking for home health assistance for the pt. Specifically, pt needs help with dressing, bathing, feeding, meal prep, exercising. Pt does have an aide that comes about 15 hours per week but pt's daughter says that pt needs 24/7 supervision. I explained that HH will most likely not cover 24/7 in home assistance but can send someone a couple times a week to help with ADLs. Pt's [place] also [place] with caring for pt and her husband. Pt is working with [facility] but they also have a connection with [facility]. They will see if [facility] can help with HH, and if not, they would like to use [facility].

## 2018-05-10 NOTE — Addendum Note (Signed)
Addended by: Geronimo RunningINKINS, Tamica Covell A on: 05/10/2018 02:17 PM   Modules accepted: Orders

## 2018-05-10 NOTE — Telephone Encounter (Signed)
pt daughter on (on [facility], [name]) has called stating that re: pt's insurance([employer]) pt has a home Health benefit.  They have told daughter that they need documentation from Dr re: level of care re: what pt needs and duration of care(exa: food prep, getting dressed and so on).  Daughter is asking for a call to discuss

## 2018-05-11 NOTE — Telephone Encounter (Signed)
Referral has been sent to [facility] °

## 2018-05-22 ENCOUNTER — Other Ambulatory Visit: Payer: Self-pay

## 2018-05-22 ENCOUNTER — Encounter (HOSPITAL_COMMUNITY): Payer: Self-pay | Admitting: Emergency Medicine

## 2018-05-22 ENCOUNTER — Emergency Department (HOSPITAL_COMMUNITY)
Admission: EM | Admit: 2018-05-22 | Discharge: 2018-05-22 | Disposition: A | Discharge status: Home / Self Care | Payer: Medicare Other | Attending: Emergency Medicine | Admitting: Emergency Medicine

## 2018-05-22 ENCOUNTER — Emergency Department (HOSPITAL_COMMUNITY): Payer: Medicare Other

## 2018-05-22 DIAGNOSIS — R059 Cough, unspecified: Secondary | ICD-10-CM

## 2018-05-22 DIAGNOSIS — R05 Cough: Secondary | ICD-10-CM | POA: Insufficient documentation

## 2018-05-22 DIAGNOSIS — Z79899 Other long term (current) drug therapy: Secondary | ICD-10-CM | POA: Insufficient documentation

## 2018-05-22 DIAGNOSIS — F0390 Unspecified dementia without behavioral disturbance: Secondary | ICD-10-CM | POA: Diagnosis not present

## 2018-05-22 DIAGNOSIS — I10 Essential (primary) hypertension: Secondary | ICD-10-CM | POA: Insufficient documentation

## 2018-05-22 MED ORDER — DEXAMETHASONE 4 MG PO TABS
10.0000 mg | ORAL_TABLET | Freq: Once | ORAL | Status: AC
  Administered 2018-05-22: 03:00:00 10 mg via ORAL
  Filled 2018-05-22: qty 2

## 2018-05-22 MED ORDER — ALBUTEROL SULFATE HFA 108 (90 BASE) MCG/ACT IN AERS
2.0000 | INHALATION_SPRAY | RESPIRATORY_TRACT | Status: DC | PRN
  Administered 2018-05-22: 2 via RESPIRATORY_TRACT
  Filled 2018-05-22: qty 6.7

## 2018-05-22 MED ORDER — IPRATROPIUM-ALBUTEROL 0.5-2.5 (3) MG/3ML IN SOLN
3.0000 mL | Freq: Once | RESPIRATORY_TRACT | Status: AC
  Administered 2018-05-22: 3 mL via RESPIRATORY_TRACT
  Filled 2018-05-22: qty 3

## 2018-05-22 NOTE — ED Provider Notes (Signed)
[facility] DEPT Provider Note   CSN: [id number] Arrival date & time: 05/22/18  0015     History   Chief Complaint Chief Complaint  Patient presents with  . Cough    HPI Kristi [name] is a 72 y.o. female.  The history is provided by the patient and a relative. The history is limited by the condition of the patient (Dementia).  Cough   She has a history of gout, hypertension, dementia and comes in with cough for the last 5 days which is gotten worse over the last 2 days.  Cough is nonproductive.  There has been no fever, chills, sweats.  She denies arthralgias or myalgias.  There has been no vomiting or diarrhea.  She has had sick contacts.  There has been no treatment at home.  Family is concerned that she might have developed pneumonia.  She has received the influenza immunization 3 months ago.  Past Medical History:  Diagnosis Date  . Gout   . Hypertension   . Vitamin B deficiency     Patient Active Problem List   Diagnosis Date Noted  . Dementia, primary degenerative, senile ([place]) 04/03/2015  . 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 Vitamin B12 deficiency (non anemic) 04/03/2015    History reviewed. No pertinent surgical history.   OB History   No obstetric history on file.      Home Medications    Prior to Admission medications   Medication Sig Start Date End Date Taking? Authorizing Provider  allopurinol (ZYLOPRIM) 300 MG tablet Take 300 mg by mouth daily. 03/19/14   [provider]  colchicine 0.6 MG tablet Take 0.6 mg by mouth daily. 03/19/14   [provider]  Cyanocobalamin (B-12) 1000 MCG/ML KIT Inject as directed every 30 (thirty) days.    [provider]  donepezil (ARICEPT) 10 MG tablet Take 1 tablet (10 mg total) by mouth at bedtime. 06/02/17   Star Age, MD  escitalopram (LEXAPRO) 5 MG tablet Take 1 tablet (5 mg total) by mouth daily. 06/02/17   Star Age, MD  losartan (COZAAR) 100 MG  tablet Take 100 mg by mouth daily.    [provider]  memantine (NAMENDA XR) 28 MG CP24 24 hr capsule Take 1 capsule (28 mg total) by mouth at bedtime. 06/02/17   Star Age, MD    Family History Family History  Problem Relation Age of Onset  . Cancer Mother        skin     Social History Social History   Tobacco Use  . Smoking status: Never Smoker  . Smokeless tobacco: Never Used  Substance Use Topics  . Alcohol use: No    Alcohol/week: 0.0 standard drinks  . Drug use: No     Allergies   Patient has no known allergies.   Review of Systems Review of Systems  Unable to perform ROS: Dementia  Respiratory: Positive for cough.      Physical Exam Updated Vital Signs BP (!) 162/100 (BP Location: Left Arm)   Pulse (!) 58   Temp 98.3 F (36.8 C) (Oral)   Resp 20   SpO2 99%   Physical Exam Vitals signs and nursing note reviewed.    72 year old female, resting comfortably and in no acute distress. Vital signs are significant for elevated blood pressure and borderline low heart rate. Oxygen saturation is 99%, which is normal. Head is normocephalic and atraumatic. PERRLA, EOMI. Oropharynx is clear. Neck is nontender and  supple without adenopathy or JVD. Back is nontender and there is no CVA tenderness. Lungs are clear without rales, wheezes, or rhonchi. Chest is nontender. Heart has regular rate and rhythm without murmur. Abdomen is soft, flat, nontender without masses or hepatosplenomegaly and peristalsis is normoactive. Extremities have no cyanosis or edema, full range of motion is present. Skin is warm and dry without rash. Neurologic: Awake and alert, oriented to person and place but not time, cranial nerves are intact, there are no motor or sensory deficits.  ED Treatments / Results   Radiology Dg Chest 2 View  Result Date: 05/22/2018 CLINICAL DATA:  Cough EXAM: CHEST - 2 VIEW COMPARISON:  03/09/2004 FINDINGS: Mild convex left thoracic spondylosis.  Thoracolumbar spine curvature. Midline trachea. Normal heart size. Atherosclerosis in the transverse aorta. No pleural effusion or pneumothorax. Clear lungs. IMPRESSION: 1. No acute cardiopulmonary disease. 2. Aortic Atherosclerosis (ICD10-I70.0). Electronically Signed   By: Abigail Miyamoto M.D.   On: 05/22/2018 01:03    Procedures Procedures (including critical care time)  Medications Ordered in ED Medications  albuterol (PROVENTIL HFA;VENTOLIN HFA) 108 (90 Base) MCG/ACT inhaler 2 puff (has no administration in time range)  dexamethasone (DECADRON) tablet 10 mg (has no administration in time range)  ipratropium-albuterol (DUONEB) 0.5-2.5 (3) MG/3ML nebulizer solution 3 mL (3 mLs Nebulization Given 05/22/18 0140)     Initial Impression / Assessment and Plan / ED Course  I have reviewed the triage vital signs and the nursing notes.  Pertinent imaging results that were available during my care of the patient were reviewed by me and considered in my medical decision making (see chart for details).  Cough.  Chest x-ray obtained in ED shows no evidence of pneumonia.  This appears to be viral bronchitis.  Old records are reviewed, and she has no relevant past visits.  She is given an albuterol with nebulizer treatment which did seem to improve the cough.  She is given a single dose of dexamethasone and discharged with an albuterol inhaler to use at home as needed.  Return precautions discussed.  Final Clinical Impressions(s) / ED Diagnoses   Final diagnoses:  Cough    ED Discharge Orders    None       Delora Fuel, MD 61/68/37 [phone]

## 2018-05-22 NOTE — Discharge Instructions (Addendum)
Use the inhaler - two puffs every four hours - as needed for coughing.  Return if symptoms are getting worse, especially if you start running a fever.

## 2018-05-22 NOTE — ED Triage Notes (Signed)
Pt presents with a cough that began a week ago and has progressively gotten worse. Patient states cough is not productive but feels like it should be. Denies fevers. Patient endorses fatigue today.

## 2018-05-31 ENCOUNTER — Ambulatory Visit (HOSPITAL_COMMUNITY)
Admission: EM | Admit: 2018-05-31 | Discharge: 2018-05-31 | Disposition: A | Discharge status: Home / Self Care | Payer: Medicare Other | Attending: Emergency Medicine | Admitting: Emergency Medicine

## 2018-05-31 ENCOUNTER — Encounter (HOSPITAL_COMMUNITY): Payer: Self-pay

## 2018-05-31 ENCOUNTER — Ambulatory Visit (INDEPENDENT_AMBULATORY_CARE_PROVIDER_SITE_OTHER): Payer: Medicare Other

## 2018-05-31 DIAGNOSIS — J209 Acute bronchitis, unspecified: Secondary | ICD-10-CM | POA: Diagnosis not present

## 2018-05-31 DIAGNOSIS — R059 Cough, unspecified: Secondary | ICD-10-CM

## 2018-05-31 DIAGNOSIS — R05 Cough: Secondary | ICD-10-CM | POA: Diagnosis not present

## 2018-05-31 MED ORDER — PREDNISONE 10 MG (21) PO TBPK: ORAL_TABLET | Freq: Every day | ORAL | 0 refills | Status: DC

## 2018-05-31 NOTE — Discharge Instructions (Addendum)
Use a humidifier  Use inhaler as needed The cough can linger for several weeks  May use delsym over the counter cough meds  Stay hydrated

## 2018-05-31 NOTE — ED Provider Notes (Signed)
[facility]    CSN: [id number] Arrival date & time: 05/31/18  0822     History   Chief Complaint Chief Complaint  Patient presents with  . Cough    HPI Kristi [name] is a 72 y.o. female.   Pts daughter brought in pt . For 3 weeks now has been treating mother for cough. Was seen at [facility] 2 weeks ago and was given an inhaler told to follow up with pcp. Seen pcp last week placed on amox. Daughter states that the cough is worse. At times coughing up different color phlem. She would like another chest x ray to rule out pneumonia.denies any fevers. Has taken 7 days of amox. No change. inahler only helps intermit. Denies any hx of resp issues.      Past Medical History:  Diagnosis Date  . Gout   . Hypertension   . Vitamin B deficiency     Patient Active Problem List   Diagnosis Date Noted  . Dementia, primary degenerative, senile (HCC) 04/03/2015  . 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 Vitamin B12 deficiency (non anemic) 04/03/2015    History reviewed. No pertinent surgical history.  OB History   No obstetric history on file.      Home Medications    Prior to Admission medications   Medication Sig Start Date End Date Taking? Authorizing Provider  allopurinol (ZYLOPRIM) 300 MG tablet Take 300 mg by mouth daily. 03/19/14   [provider]  colchicine 0.6 MG tablet Take 0.6 mg by mouth daily. 03/19/14   [provider]  Cyanocobalamin (B-12) 1000 MCG/ML KIT Inject as directed every 30 (thirty) days.    [provider]  donepezil (ARICEPT) 10 MG tablet Take 1 tablet (10 mg total) by mouth at bedtime. 06/02/17   Athar, Saima, MD  escitalopram (LEXAPRO) 5 MG tablet Take 1 tablet (5 mg total) by mouth daily. 06/02/17   Athar, Saima, MD  losartan (COZAAR) 100 MG tablet Take 100 mg by mouth daily.    [provider]  memantine (NAMENDA XR) 28 MG CP24 24 hr capsule Take 1 capsule (28 mg total) by mouth at bedtime. 06/02/17   Athar,  Saima, MD  predniSONE (STERAPRED UNI-PAK 21 TAB) 10 MG (21) TBPK tablet Take by mouth daily. Take 6 tabs by mouth daily  for 2 days, then 5 tabs for 2 days, then 4 tabs for 2 days, then 3 tabs for 2 days, 2 tabs for 2 days, then 1 tab by mouth daily for 2 days 05/31/18   Mitchell, Melanie L, NP    Family History Family History  Problem Relation Age of Onset  . Cancer Mother        skin     Social History Social History   Tobacco Use  . Smoking status: Never Smoker  . Smokeless tobacco: Never Used  Substance Use Topics  . Alcohol use: No    Alcohol/week: 0.0 standard drinks  . Drug use: No     Allergies   Patient has no known allergies.   Review of Systems Review of Systems  HENT: Positive for congestion.   Respiratory: Positive for cough.   Cardiovascular: Negative.   Gastrointestinal: Negative.   Genitourinary: Negative.   Musculoskeletal: Negative.   Skin: Negative.   Neurological:       Hx of dementia not a good historian      Physical Exam Triage Vital Signs ED Triage Vitals  Enc Vitals Group       BP 05/31/18 0905 (!) 148/90     Pulse Rate 05/31/18 0905 67     Resp 05/31/18 0905 18     Temp 05/31/18 0905 98 F (36.7 C)     Temp Source 05/31/18 0905 Oral     SpO2 05/31/18 0905 (!) 67 %     Weight --      Height --      Head Circumference --      Peak Flow --      Pain Score 05/31/18 0908 0     Pain Loc --      Pain Edu? --      Excl. in [place]? --    No data found.  Updated Vital Signs BP (!) 148/90 (BP Location: Right Arm)   Pulse 67   Temp 98 F (36.7 C) (Oral)   Resp 18   SpO2 95%   Visual Acuity Right Eye Distance:   Left Eye Distance:   Bilateral Distance:    Right Eye Near:   Left Eye Near:    Bilateral Near:     Physical Exam HENT:     Head: Normocephalic.     Right Ear: Tympanic membrane normal.     Left Ear: Tympanic membrane normal.     Nose: Nose normal.     Mouth/Throat:     Mouth: Mucous membranes are moist.  Eyes:      Pupils: Pupils are equal, round, and reactive to light.  Neck:     Musculoskeletal: Normal range of motion.  Cardiovascular:     Rate and Rhythm: Normal rate.     Pulses: Normal pulses.  Pulmonary:     Breath sounds: Wheezing present.     Comments: bil lower bases  Abdominal:     General: Abdomen is flat. Bowel sounds are normal.  Musculoskeletal: Normal range of motion.  Skin:    General: Skin is warm.     Capillary Refill: Capillary refill takes less than 2 seconds.  Neurological:     General: No focal deficit present.     Mental Status: She is alert. Mental status is at baseline.      UC Treatments / Results  Labs (all labs ordered are listed, but only abnormal results are displayed) Labs Reviewed - No data to display  EKG None  Radiology Dg Chest 2 View  Result Date: 05/31/2018 CLINICAL DATA:  Cough and wheezing for the past 3 weeks. EXAM: CHEST - 2 VIEW COMPARISON:  Chest x-ray dated May 22, 2018. FINDINGS: The heart size and mediastinal contours are within normal limits. Normal pulmonary vascularity. Atherosclerotic calcification of the aortic arch. No focal consolidation, pleural effusion, or pneumothorax. No acute osseous abnormality. IMPRESSION: No active cardiopulmonary disease. Electronically Signed   By: William T Derry M.D.   On: 05/31/2018 10:34    Procedures Procedures (including critical care time)  Medications Ordered in UC Medications - No data to display  Initial Impression / Assessment and Plan / UC Course  I have reviewed the triage vital signs and the nursing notes.  Pertinent labs & imaging results that were available during my care of the patient were reviewed by me and considered in my medical decision making (see chart for details).    Use a humidifier  Use inhaler as needed The cough can linger for several weeks  May use delsym over the counter cough meds  Stay hydrated   Final Clinical Impressions(s) / UC Diagnoses   Final  diagnoses:  Cough    Acute bronchitis, unspecified organism     Discharge Instructions     Use a humidifier  Use inhaler as needed The cough can linger for several weeks  May use delsym over the counter cough meds  Stay hydrated     ED Prescriptions    Medication Sig Dispense Auth. Provider   predniSONE (STERAPRED UNI-PAK 21 TAB) 10 MG (21) TBPK tablet Take by mouth daily. Take 6 tabs by mouth daily  for 2 days, then 5 tabs for 2 days, then 4 tabs for 2 days, then 3 tabs for 2 days, 2 tabs for 2 days, then 1 tab by mouth daily for 2 days 42 tablet Marney Setting, NP     Controlled Substance Prescriptions [place] Controlled Substance Registry consulted? Not Applicable   Marney Setting, NP 05/31/18 1046

## 2018-05-31 NOTE — ED Triage Notes (Addendum)
Per Pt daughter, pt present a cough for over a week.  Symptoms are Non productive sometimes. Pt is having difficulty catching her breath and coughing up mucus.

## 2018-06-09 ENCOUNTER — Other Ambulatory Visit: Payer: Self-pay | Admitting: Neurology

## 2018-06-09 DIAGNOSIS — F0280 Dementia in other diseases classified elsewhere without behavioral disturbance: Secondary | ICD-10-CM

## 2018-06-09 DIAGNOSIS — G309 Alzheimer's disease, unspecified: Principal | ICD-10-CM

## 2018-06-09 DIAGNOSIS — F32A Depression, unspecified: Secondary | ICD-10-CM

## 2018-06-09 DIAGNOSIS — F329 Major depressive disorder, single episode, unspecified: Secondary | ICD-10-CM

## 2018-06-13 ENCOUNTER — Ambulatory Visit: Payer: Medicare Other | Admitting: Neurology

## 2018-06-13 ENCOUNTER — Telehealth: Payer: Self-pay

## 2018-06-13 NOTE — Telephone Encounter (Signed)
Pt did not show for their appt with Dr. Athar today.  

## 2018-06-14 ENCOUNTER — Encounter: Payer: Self-pay | Admitting: Neurology

## 2018-06-25 ENCOUNTER — Ambulatory Visit
Admission: EM | Admit: 2018-06-25 | Discharge: 2018-06-25 | Disposition: A | Discharge status: Home / Self Care | Payer: Medicare Other | Attending: Family Medicine | Admitting: Family Medicine

## 2018-06-25 ENCOUNTER — Ambulatory Visit: Payer: Medicare Other

## 2018-06-25 ENCOUNTER — Other Ambulatory Visit: Payer: Self-pay

## 2018-06-25 DIAGNOSIS — M25562 Pain in left knee: Secondary | ICD-10-CM | POA: Diagnosis not present

## 2018-06-25 DIAGNOSIS — W19XXXA Unspecified fall, initial encounter: Secondary | ICD-10-CM

## 2018-06-25 NOTE — Discharge Instructions (Addendum)
X-ray showed some arthritis and mild effusion or fluid in the knee. We can do Tylenol extra strength twice a day as needed for pain and inflammation For continued or worsening symptoms you need to follow-up with orthopedic Rest, ice, elevate the knee

## 2018-06-25 NOTE — ED Triage Notes (Signed)
Per family she fell and EMS diD come to house and no apparent LOC or knots on her head. Pt is complaining of left knee and some right knee pain. Pt has hx of Dementia so hard to tell pt's family explained. AT HER NORMAL PER FAMILY.

## 2018-06-27 NOTE — ED Provider Notes (Signed)
[facility]    CSN: [id number] Arrival date & time: 06/25/18  1016     History   Chief Complaint Chief Complaint  Patient presents with  . Fall    HPI Kristi [name] is a 73 y.o. female.   Patient is a 73 year old female that presents for fall.  This occurred earlier today.  She fell landing on both of her knees.  Patient does have a history of dementia and is not a very good historian.  Family here is with her and denies any head trauma.  There is no obvious bruising, swelling or deformities to these.  She has had some left knee pain for some time.  This exacerbated her pain.  She has not taken anything for the pain.  EMS came and examined her on scene and requested she be seen for further evaluation with x-ray.  She is able to ambulate on the legs without difficulty.   ROS per HPI    Fall     Past Medical History:  Diagnosis Date  . Gout   . Hypertension   . Vitamin B deficiency     Patient Active Problem List   Diagnosis Date Noted  . Dementia, primary degenerative, senile ([place]) 04/03/2015  . 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 Vitamin B12 deficiency (non anemic) 04/03/2015    No past surgical history on file.  OB History   No obstetric history on file.      Home Medications    Prior to Admission medications   Medication Sig Start Date End Date Taking? Authorizing Provider  allopurinol (ZYLOPRIM) 300 MG tablet Take 300 mg by mouth daily. 03/19/14   [provider]  colchicine 0.6 MG tablet Take 0.6 mg by mouth daily. 03/19/14   [provider]  Cyanocobalamin (B-12) 1000 MCG/ML KIT Inject as directed every 30 (thirty) days.    [provider]  donepezil (ARICEPT) 10 MG tablet Take 1 tablet (10 mg total) by mouth at bedtime. 06/02/17   Star Age, MD  escitalopram (LEXAPRO) 5 MG tablet TAKE 1 TABLET BY MOUTH EVERY DAY 06/09/18   Star Age, MD  losartan (COZAAR) 100 MG tablet Take 100 mg by mouth daily.     [provider]  memantine (NAMENDA XR) 28 MG CP24 24 hr capsule Take 1 capsule (28 mg total) by mouth at bedtime. 06/02/17   Star Age, MD  predniSONE (STERAPRED UNI-PAK 21 TAB) 10 MG (21) TBPK tablet Take by mouth daily. Take 6 tabs by mouth daily  for 2 days, then 5 tabs for 2 days, then 4 tabs for 2 days, then 3 tabs for 2 days, 2 tabs for 2 days, then 1 tab by mouth daily for 2 days 05/31/18   Marney Setting, NP    Family History Family History  Problem Relation Age of Onset  . Cancer Mother        skin     Social History Social History   Tobacco Use  . Smoking status: Never Smoker  . Smokeless tobacco: Never Used  Substance Use Topics  . Alcohol use: No    Alcohol/week: 0.0 standard drinks  . Drug use: No     Allergies   Patient has no known allergies.   Review of Systems Review of Systems  Constitutional: Negative for activity change, fatigue and fever.  Musculoskeletal: Positive for arthralgias. Negative for back pain, gait problem, joint swelling, myalgias, neck pain and neck stiffness.  Skin: Negative for color change,  pallor, rash and wound.  Hematological: Does not bruise/bleed easily.     Physical Exam Triage Vital Signs ED Triage Vitals  Enc Vitals Group     BP 06/25/18 1033 (!) 149/90     Pulse Rate 06/25/18 1033 64     Resp 06/25/18 1033 16     Temp 06/25/18 1033 97.6 F (36.4 C)     Temp Source 06/25/18 1033 Oral     SpO2 06/25/18 1033 95 %     Weight --      Height --      Head Circumference --      Peak Flow --      Pain Score 06/25/18 1035 2     Pain Loc --      Pain Edu? --      Excl. in [place]? --    No data found.  Updated Vital Signs BP (!) 149/90 (BP Location: Right Arm)   Pulse 64   Temp 97.6 F (36.4 C) (Oral)   Resp 16   SpO2 95%   Visual Acuity Right Eye Distance:   Left Eye Distance:   Bilateral Distance:    Right Eye Near:   Left Eye Near:    Bilateral Near:     Physical Exam Vitals signs and  nursing note reviewed.  Constitutional:      General: She is not in acute distress.    Appearance: Normal appearance. She is well-developed. She is not ill-appearing or toxic-appearing.  HENT:     Head: Normocephalic and atraumatic.     Right Ear: Tympanic membrane and ear canal normal.     Left Ear: Tympanic membrane and ear canal normal.     Nose: Nose normal.  Eyes:     Conjunctiva/sclera: Conjunctivae normal.  Neck:     Musculoskeletal: Neck supple. No muscular tenderness.  Cardiovascular:     Rate and Rhythm: Normal rate and regular rhythm.     Heart sounds: No murmur.  Pulmonary:     Effort: Pulmonary effort is normal. No respiratory distress.     Breath sounds: Normal breath sounds.  Abdominal:     Palpations: Abdomen is soft.     Tenderness: There is no abdominal tenderness.  Musculoskeletal: Normal range of motion.        General: Tenderness and signs of injury present. No swelling or deformity.     Right lower leg: No edema.     Left lower leg: No edema.     Comments: Mild tenderness to palpation of the left knee.  Good range of motion.  No obvious bruising, swelling, deformities.  Nontender to palpation of the right knee.  Good range of motion.  No obvious bruising, swelling or deformities.  Skin:    General: Skin is warm and dry.  Neurological:     Mental Status: She is alert. Mental status is at baseline.  Psychiatric:        Mood and Affect: Mood normal.        Behavior: Behavior normal.      UC Treatments / Results  Labs (all labs ordered are listed, but only abnormal results are displayed) Labs Reviewed - No data to display  EKG None  Radiology Dg Knee Complete 4 Views Left  Result Date: 06/25/2018 CLINICAL DATA:  Left knee pain. EXAM: LEFT KNEE - COMPLETE 4+ VIEW COMPARISON:  None. 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 No  evidence of  fracture or dislocation. There is a small amount joint fluid in the suprapatellar pouch. No bony lesions or destruction. 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 Electronically Signed   By: Aletta Edouard M.D.   On: 06/25/2018 11:37    Procedures Procedures (including critical care time)  Medications Ordered in UC Medications - No data to display  Initial Impression / Assessment and Plan / UC Course  I have reviewed the triage vital signs and the nursing notes.  Pertinent labs & imaging results that were available during my care of the patient were reviewed by me and considered in my medical decision making (see chart for details).     X-ray of the left knee revealed no acute fracture.  Revealed some osteoarthritis and mild joint effusion. Exam otherwise normal. Patient is neurologically at baseline. Vital signs stable. We will have her take Tylenol extra strength for pain and follow-up with her doctor as needed  Final Clinical Impressions(s) / UC Diagnoses   Final diagnoses:  Fall, initial encounter  Acute pain of left knee     Discharge Instructions     X-ray showed some arthritis and mild effusion or fluid in the knee. We can do Tylenol extra strength twice a day as needed for pain and inflammation For continued or worsening symptoms you need to follow-up with orthopedic Rest, ice, elevate the knee    ED Prescriptions    None     Controlled Substance Prescriptions  Controlled Substance Registry consulted? Not Applicable   Orvan July, NP 06/27/18 [phone]

## 2018-08-16 ENCOUNTER — Other Ambulatory Visit: Payer: Self-pay | Admitting: Neurology

## 2018-08-16 DIAGNOSIS — F0280 Dementia in other diseases classified elsewhere without behavioral disturbance: Secondary | ICD-10-CM

## 2018-08-16 DIAGNOSIS — G309 Alzheimer's disease, unspecified: Principal | ICD-10-CM

## 2018-08-17 ENCOUNTER — Other Ambulatory Visit: Payer: Self-pay | Admitting: Neurology

## 2018-09-11 ENCOUNTER — Other Ambulatory Visit: Payer: Self-pay | Admitting: Neurology

## 2018-09-11 DIAGNOSIS — G309 Alzheimer's disease, unspecified: Principal | ICD-10-CM

## 2018-09-11 DIAGNOSIS — F0280 Dementia in other diseases classified elsewhere without behavioral disturbance: Secondary | ICD-10-CM

## 2018-09-13 ENCOUNTER — Other Ambulatory Visit: Payer: Self-pay | Admitting: Neurology

## 2018-10-11 ENCOUNTER — Other Ambulatory Visit: Payer: Self-pay | Admitting: Neurology

## 2018-10-11 NOTE — Telephone Encounter (Signed)
Pt is overdue for an appt, requesting a refill on her medication. I called pt to schedule her for a virtual visit. No answer, left a VM asking her to call us back. If pt calls back, please schedule her for a VV with an NP.

## 2018-10-26 ENCOUNTER — Telehealth: Payer: Self-pay

## 2018-10-26 NOTE — Telephone Encounter (Signed)
Opened in error

## 2018-11-12 ENCOUNTER — Other Ambulatory Visit: Payer: Self-pay | Admitting: Neurology

## 2018-11-20 ENCOUNTER — Other Ambulatory Visit: Payer: Self-pay | Admitting: Neurology

## 2019-01-25 ENCOUNTER — Telehealth: Payer: Self-pay | Admitting: Neurology

## 2019-01-25 NOTE — Telephone Encounter (Signed)
I called pt, spoke to pt's daughter, Guy Begin, per DPR. She reports that over the past few days that pt has been hard to wake up. She seems to be very sleepy. Pt's daughter wants to know if this is a part of the dementia disease process. I advised her that if this is a change from her baseline, pt may need to be evaluated urgently, by UC or ER, and in the very least by PCP. We have not evaluated pt in over one year. Pt's daughter reports that she will have pt evaluated ASAP and plans on calling her PCP first though. An appt was scheduled with Amy, NP on 01/30/19 at 9:00am as well. Pt's daughter verbalized understanding of recommendations.

## 2019-01-25 NOTE — Telephone Encounter (Signed)
Pt's daughter called stating that it has been difficult to wake her mother up lately and she would like to discuss this with the RN. Pt's daughter was made aware that the pt has not been in for a year. She stated she understood but would like to discuss with RN first.

## 2019-01-25 NOTE — Telephone Encounter (Signed)
You are right, this can be from any number of issues, needs to see PCP or call 911. Thanks, nothing further needed.

## 2019-01-30 ENCOUNTER — Other Ambulatory Visit: Payer: Self-pay

## 2019-01-30 ENCOUNTER — Ambulatory Visit (INDEPENDENT_AMBULATORY_CARE_PROVIDER_SITE_OTHER): Payer: Medicare Other | Admitting: Family Medicine

## 2019-01-30 ENCOUNTER — Encounter: Payer: Self-pay | Admitting: Family Medicine

## 2019-01-30 VITALS — BP 104/64 | HR 77 | Temp 97.1°F | Ht 67.0 in | Wt 132.0 lb

## 2019-01-30 DIAGNOSIS — F0280 Dementia in other diseases classified elsewhere without behavioral disturbance: Secondary | ICD-10-CM | POA: Diagnosis not present

## 2019-01-30 DIAGNOSIS — G309 Alzheimer's disease, unspecified: Secondary | ICD-10-CM

## 2019-01-30 MED ORDER — DONEPEZIL HCL 10 MG PO TABS: ORAL_TABLET | ORAL | 11 refills | Status: AC

## 2019-01-30 MED ORDER — MEMANTINE HCL ER 28 MG PO CP24: ORAL_CAPSULE | ORAL | 11 refills | Status: AC

## 2019-01-30 NOTE — Patient Instructions (Signed)
Please continue Namenda and Aricept  Safety and fall precautions   Follow up in 6 months, sooner if needed  Dementia Caregiver Guide Dementia is a term used to describe a number of symptoms that affect memory and thinking. The most common symptoms include:  Memory loss.  Trouble with language and communication.  Trouble concentrating.  Poor judgment.  Problems with reasoning.  Child-like behavior and language.  Extreme anxiety.  Angry outbursts.  Wandering from home or public places. Dementia usually gets worse slowly over time. In the early stages, people with dementia can stay independent and safe with some help. In later stages, they need help with daily tasks such as dressing, grooming, and using the bathroom. How to help the person with dementia cope Dementia can be frightening and confusing. Here are some tips to help the person with dementia cope with changes caused by the disease. General tips  Keep the person on track with his or her routine.  Try to identify areas where the person may need help.  Be supportive, patient, calm, and encouraging.  Gently remind the person that adjusting to changes takes time.  Help with the tasks that the person has asked for help with.  Keep the person involved in daily tasks and decisions as much as possible.  Encourage conversation, but try not to get frustrated or harried if the person struggles to find words or does not seem to appreciate your help. Communication tips  When the person is talking or seems frustrated, make eye contact and hold the person's hand.  Ask specific questions that need yes or no answers.  Use simple words, short sentences, and a calm voice. Only give one direction at a time.  When offering choices, limit them to just 1 or 2.  Avoid correcting the person in a negative way.  If the person is struggling to find the right words, gently try to help him or her. How to recognize symptoms of  stress Symptoms of stress in caregivers include:  Feeling frustrated or angry with the person with dementia.  Denying that the person has dementia or that his or her symptoms will not improve.  Feeling hopeless and unappreciated.  Difficulty sleeping.  Difficulty concentrating.  Feeling anxious, irritable, or depressed.  Developing stress-related health problems.  Feeling like you have too little time for your own life. Follow these instructions at home:   Make sure that you and the person you are caring for: ? Get regular sleep. ? Exercise regularly. ? Eat regular, nutritious meals. ? Drink enough fluid to keep your urine clear or pale yellow. ? Take over-the-counter and prescription medicines only as told by your health care providers. ? Attend all scheduled health care appointments.  Join a support group with others who are caregivers.  Ask about respite care resources so that you can have a regular break from the stress of caregiving.  Look for signs of stress in yourself and in the person you are caring for. If you notice signs of stress, take steps to manage it.  Consider any safety risks and take steps to avoid them.  Organize medications in a pill box for each day of the week.  Create a plan to handle any legal or financial matters. Get legal or financial advice if needed.  Keep a calendar in a central location to remind the person of appointments or other activities. Tips for reducing the risk of injury  Keep floors clear of clutter. Remove rugs, magazine  racks, and floor lamps.  Keep hallways well lit, especially at night.  Put a handrail and nonslip mat in the bathtub or shower.  Put childproof locks on cabinets that contain dangerous items, such as medicines, alcohol, guns, toxic cleaning items, sharp tools or utensils, matches, and lighters.  Put the locks in places where the person cannot see or reach them easily. This will help ensure that the person  does not wander out of the house and get lost.  Be prepared for emergencies. Keep a list of emergency phone numbers and addresses in a convenient area.  Remove car keys and lock garage doors so that the person does not try to get in the car and drive.  Have the person wear a bracelet that tracks locations and identifies the person as having memory problems. This should be worn at all times for safety. Where to find support: Many individuals and organizations offer support. These include:  Support groups for people with dementia and for caregivers.  Counselors or therapists.  Home health care services.  Adult day care centers. Where to find more information Alzheimer's Association: LimitLaws.huwww.alz.org Contact a health care provider if:  The person's health is rapidly getting worse.  You are no longer able to care for the person.  Caring for the person is affecting your physical and emotional health.  The person threatens himself or herself, you, or anyone else. Summary  Dementia is a term used to describe a number of symptoms that affect memory and thinking.  Dementia usually gets worse slowly over time.  Take steps to reduce the person's risk of injury, and to plan for future care.  Caregivers need support, relief from caregiving, and time for their own lives. This information is not intended to replace advice given to you by your health care provider. Make sure you discuss any questions you have with your health care provider. Document Released: 04/21/2016 Document Revised: 04/30/2017 Document Reviewed: 04/21/2016 Elsevier Patient Education  2020 Elsevier Inc.   Dementia Dementia is a condition that affects the way the brain functions. It often affects memory and thinking. Usually, dementia gets worse with time and cannot be reversed (progressive dementia). There are many types of dementia, including:  Alzheimer's disease. This type is the most common.  Vascular dementia.  This type may happen as the result of a stroke.  Lewy body dementia. This type may happen to people who have Parkinson's disease.  Frontotemporal dementia. This type is caused by damage to nerve cells (neurons) in certain parts of the brain. Some people may be affected by more than one type of dementia. This is called mixed dementia. What are the causes? Dementia is caused by damage to cells in the brain. The area of the brain and the types of cells damaged determine the type of dementia. Usually, this damage is irreversible or cannot be undone. Some examples of irreversible causes include:  Conditions that affect the blood vessels of the brain, such as diabetes, heart disease, or blood vessel disease.  Genetic mutations. In some cases, changes in the brain may be caused by another condition and can be reversed or slowed. Some examples of reversible causes include:  Injury to the brain.  Certain medicines.  Infection, such as meningitis.  Metabolic problems, such as vitamin B12 deficiency or thyroid disease.  Pressure on the brain, such as from a tumor or blood clot. What are the signs or symptoms? Symptoms of dementia depend on the type of dementia. Common signs of  dementia include problems with remembering, thinking, problem solving, decision making, and communicating. These signs develop slowly or get worse with time. This may include:  Problems remembering things.  Having trouble taking a bath or putting clothes on.  Forgetting appointments.  Forgetting to pay bills.  Difficulty planning and preparing meals.  Having trouble speaking.  Getting lost easily. How is this diagnosed? This condition is diagnosed by a specialist (neurologist). It is diagnosed based on the history of your symptoms, your medical history, a physical exam, and tests. Tests may include:  Tests to evaluate brain function, such as memory tests, cognitive tests, and other tests.  Lab tests, such as  blood or urine tests.  Imaging tests, such as a CT scan, a PET scan, or an MRI.  Genetic testing. This may be done if other family members have a diagnosis of certain types of dementia. Your health care provider will talk with you and your family, friends, or caregivers about your history and symptoms. How is this treated?  Treatment for this condition depends on the cause of the dementia. Progressive dementias, such as Alzheimer's disease, cannot be cured, but there may be treatments that help to manage symptoms. Treatment might involve taking medicines that may help to:  Control the dementia.  Slow down the progression of the dementia.  Manage symptoms. In some cases, treating the cause of your dementia can improve symptoms, reverse symptoms, or slow down how quickly your dementia becomes worse. Your health care provider can direct you to support groups, organizations, and other health care providers who can help with decisions about your care. Follow these instructions at home: Medicines  Take over-the-counter and prescription medicines only as told by your health care provider.  Use a pill organizer or pill reminder to help you manage your medicines.  Avoid taking medicines that can affect thinking, such as pain medicines or sleeping medicines. Lifestyle  Make healthy lifestyle choices. ? Be physically active as told by your health care provider. ? Do not use any products that contain nicotine or tobacco, such as cigarettes, e-cigarettes, and chewing tobacco. If you need help quitting, ask your health care provider. ? Do not drink alcohol. ? Practice stress-management techniques when you get stressed. ? Spend time with other people.  Make sure to get quality sleep. These tips can help you get a good night's rest: ? Avoid napping during the day. ? Keep your sleeping area dark and cool. ? Avoid exercising during the few hours before you go to bed. ? Avoid caffeine products in  the evening. Eating and drinking  Drink enough fluid to keep your urine pale yellow.  Eat a healthy diet. General instructions   Work with your health care provider to determine what you need help with and what your safety needs are.  Talk with your health care provider about whether it is safe for you to drive.  If you were given a bracelet that identifies you as a person with memory loss or tracks your location, make sure to wear it at all times.  Work with your family to make important decisions, such as advance directives, medical power of attorney, or a living will.  Keep all follow-up visits as told by your health care provider. This is important. Where to find more information  Alzheimer's Association: LimitLaws.huwww.alz.org  [employer] on Aging: CashCowGambling.bewww.nia.nih.gov/alzheimers  World Health Organization: https://castaneda-walker.com/www.who.int Contact a health care provider if:  You have any new or worsening symptoms.  You have problems with  choking or swallowing. Get help right away if:  You feel depressed or sad, or feel that you want to harm yourself.  Your family members become concerned for your safety. If you ever feel like you may hurt yourself or others, or have thoughts about taking your own life, get help right away. You can go to your nearest emergency department or call:  Your local emergency services (911 in the [place]  A suicide crisis helpline, such as the National Suicide Prevention Lifeline at (423)436-5696. This is open 24 hours a day. Summary  Dementia is a condition that affects the way the brain functions. Dementia often affects memory and thinking.  Usually, dementia gets worse with time and cannot be reversed (progressive dementia).  Treatment for this condition depends on the cause of the dementia.  Work with your health care provider to determine what you need help with and what your safety needs are.  Your health care provider can direct you to support groups,  organizations, and other health care providers who can help with decisions about your care. This information is not intended to replace advice given to you by your health care provider. Make sure you discuss any questions you have with your health care provider. Document Released: 11/11/2000 Document Revised: 08/02/2018 Document Reviewed: 08/02/2018 Elsevier Patient Education  2020 [employer].  Fall Prevention in the Home, Adult Falls can cause injuries. They can happen to people of all ages. There are many things you can do to make your home safe and to help prevent falls. Ask for help when making these changes, if needed. What actions can I take to prevent falls? General Instructions  Use good lighting in all rooms. Replace any light bulbs that burn out.  Turn on the lights when you go into a dark area. Use night-lights.  Keep items that you use often in easy-to-reach places. Lower the shelves around your home if necessary.  Set up your furniture so you have a clear path. Avoid moving your furniture around.  Do not have throw rugs and other things on the floor that can make you trip.  Avoid walking on wet floors.  If any of your floors are uneven, fix them.  Add color or contrast paint or tape to clearly mark and help you see: ? Any grab bars or handrails. ? First and last steps of stairways. ? Where the edge of each step is.  If you use a stepladder: ? Make sure that it is fully opened. Do not climb a closed stepladder. ? Make sure that both sides of the stepladder are locked into place. ? Ask someone to hold the stepladder for you while you use it.  If there are any pets around you, be aware of where they are. What can I do in the bathroom?      Keep the floor dry. Clean up any water that spills onto the floor as soon as it happens.  Remove soap buildup in the tub or shower regularly.  Use non-skid mats or decals on the floor of the tub or shower.  Attach bath  mats securely with double-sided, non-slip rug tape.  If you need to sit down in the shower, use a plastic, non-slip stool.  Install grab bars by the toilet and in the tub and shower. Do not use towel bars as grab bars. What can I do in the bedroom?  Make sure that you have a light by your bed that is easy to reach.  Do not use any sheets or blankets that are too big for your bed. They should not hang down onto the floor.  Have a firm chair that has side arms. You can use this for support while you get dressed. What can I do in the kitchen?  Clean up any spills right away.  If you need to reach something above you, use a strong step stool that has a grab bar.  Keep electrical cords out of the way.  Do not use floor polish or wax that makes floors slippery. If you must use wax, use non-skid floor wax. What can I do with my stairs?  Do not leave any items on the stairs.  Make sure that you have a light switch at the top of the stairs and the bottom of the stairs. If you do not have them, ask someone to add them for you.  Make sure that there are handrails on both sides of the stairs, and use them. Fix handrails that are broken or loose. Make sure that handrails are as long as the stairways.  Install non-slip stair treads on all stairs in your home.  Avoid having throw rugs at the top or bottom of the stairs. If you do have throw rugs, attach them to the floor with carpet tape.  Choose a carpet that does not hide the edge of the steps on the stairway.  Check any carpeting to make sure that it is firmly attached to the stairs. Fix any carpet that is loose or worn. What can I do on the outside of my home?  Use bright outdoor lighting.  Regularly fix the edges of walkways and driveways and fix any cracks.  Remove anything that might make you trip as you walk through a door, such as a raised step or threshold.  Trim any bushes or trees on the path to your home.  Regularly check  to see if handrails are loose or broken. Make sure that both sides of any steps have handrails.  Install guardrails along the edges of any raised decks and porches.  Clear walking paths of anything that might make someone trip, such as tools or rocks.  Have any leaves, snow, or ice cleared regularly.  Use sand or salt on walking paths during winter.  Clean up any spills in your garage right away. This includes grease or oil spills. What other actions can I take?  Wear shoes that: ? Have a low heel. Do not wear high heels. ? Have rubber bottoms. ? Are comfortable and fit you well. ? Are closed at the toe. Do not wear open-toe sandals.  Use tools that help you move around (mobility aids) if they are needed. These include: ? Canes. ? Walkers. ? Scooters. ? Crutches.  Review your medicines with your doctor. Some medicines can make you feel dizzy. This can increase your chance of falling. Ask your doctor what other things you can do to help prevent falls. Where to find more information  Centers for Disease Control and Prevention, STEADI: https://garcia.biz/  [employer] on Aging: BrainJudge.co.uk Contact a doctor if:  You are afraid of falling at home.  You feel weak, drowsy, or dizzy at home.  You fall at home. Summary  There are many simple things that you can do to make your home safe and to help prevent falls.  Ways to make your home safe include removing tripping hazards and installing grab bars in the bathroom.  Ask for help when making  these changes in your home. This information is not intended to replace advice given to you by your health care provider. Make sure you discuss any questions you have with your health care provider. Document Released: 03/14/2009 Document Revised: 09/08/2018 Document Reviewed: 12/31/2016 Elsevier Patient Education  2020 Elsevier Inc.   Memory Compensation Strategies  1. Use "WARM" strategy.  W= write it down  A=  associate it  R= repeat it  M= make a mental note  2.   You can keep a Glass blower/designer.  Use a 3-ring notebook with sections for the following: calendar, important names and phone numbers,  medications, doctors' names/phone numbers, lists/reminders, and a section to journal what you did  each day.   3.    Use a calendar to write appointments down.  4.    Write yourself a schedule for the day.  This can be placed on the calendar or in a separate section of the Memory Notebook.  Keeping a  regular schedule can help memory.  5.    Use medication organizer with sections for each day or morning/evening pills.  You may need help loading it  6.    Keep a basket, or pegboard by the door.  Place items that you need to take out with you in the basket or on the pegboard.  You may also want to  include a message board for reminders.  7.    Use sticky notes.  Place sticky notes with reminders in a place where the task is performed.  For example: " turn off the  stove" placed by the stove, "lock the door" placed on the door at eye level, " take your medications" on  the bathroom mirror or by the place where you normally take your medications.  8.    Use alarms/timers.  Use while cooking to remind yourself to check on food or as a reminder to take your medicine, or as a  reminder to make a call, or as a reminder to perform another task, etc.

## 2019-01-30 NOTE — Progress Notes (Addendum)
PATIENT: Kristi [name] DOB: [birth year]  REASON FOR VISIT: follow up HISTORY FROM: patient  Chief Complaint  Patient presents with  . Follow-up    Room 2,with daughter. Alzheimers f/u. "has had falls.Isnt vocal. not bathing. Having to sponge bath"  . Memory Loss     HISTORY OF PRESENT ILLNESS: Today 01/30/19 Kristi [name] is a 73 y.o. female here today for follow up of dementia. She presents today with her daughter, [name], who reports that she has had a significant decline over the past year. [name]'s husband was very ill over the past year. The family was trying to take care of both parents. She admits that there have been some inconsistencies with medications.She was taking Namenda XR and Aricept but family is not sure if she has been taking it recently. She requires full care most of the time. She can't use spoon or fork. Occasionally she can feed herself finger foods. She has lost some weight over the past year. She has been more sleepy. Sometimes it is difficult to arouse her. She has had multiple falls. Her balance remains an issue. She denies injuries with falls. She is not using any assistive devices when ambulating. They have tried to get her to use a walker in the past but she would not comply. They have installed motion activated lights. They have locks on the doors. She does not drive.   [name] lives with her husband at home. Mr Altic is doing better and family feels he is capable of caring for [name]. [name] usually helps her parents during the day but has a 21 year old and her husband is a dialysis patient. Her husband has a health care aide that comes four days a week from 4-9 (not caring for [name]). Her family does not feel they can afford private care for [name].    HISTORY: (copied from Dr Guadelupe Sabin note on 12/09/2017)  Interim history:   [name] is a 73 year old right-handed woman with an underlying medical history of hypertension and arthritis/gout, who presents  for follow-up consultation of her progressive dementia. She is accompanied by her husband, one of her daughters, and her brother in law, Ralph,today. I last saw her on 06/02/2017, which time she reported some loss of appetite. Her brother-in-law was helping out as much as he could. Her husband was also having his own medical issues. I suggested we continue with her medications for her memory loss  She was seen by Cecille Rubin, NP, in the interim 10/14/2017, at which time her MMSE was 5 out of 30. Clock drawing was 0/0 and animal fluency was 1/m.  Today, 12/09/2017: She reports very little on her own. She is conversing well but has significant difficulty finding her words and explaining what she is trying to say. Her other daughter, [name], has questioned whether she has hearing loss. Her husband wonders about it too but [name], who is here today, does not notice hearing loss as much as may be difficulty processing what she hears. There is no obvious anxiety or depression, she had some milder visual hallucinations in the recent past, sometimes something she watches on TV may seem real to her. Appetite is generally okay, but she eats more snacks such as potato chips, popcorn, chocolate bars, wt is stable.   The patient's allergies, current medications, family history, past medical history, past social history, past surgical history and problem list were reviewed and updated as appropriate.  Previously (copied from previous notes for reference):  I saw her on 10/06/2016, at which time her husband noted that she was more sleepy during the day. We talked about her driving again and she was advised no longer to drive. She had no significant depression or anxiety. She was advised to continue her dual memory medications.   I saw her on 12/02/2015, at which time she reported doing well. She try to stay active. She was not driving at the time but requested to drive shorter distances. I did ask her to no  longer drive. Mood wise, she felt stable. She was taking low-dose Lexapro. Her MMSE was 14 out of 30 at the time, animal fluency 8.  She saw Cecille Rubin in the interim on 03/09/2016, at which time her MMSE showed a score of 13, she was unable to draw a clock and AFT was 2 in 30 seconds.  She saw Cecille Rubin on 06/08/2016, at which time her MMSE was 12/30, CDT 0/4. She was kept on Namenda XR 28 mg, generic Aricept 10 mg daily and Lexapro 5 mg daily.  I saw her on 06/06/2015, at which time she was tolerating generic Aricept 10 mg daily and Namenda long-acting 14 mg daily, reported however interim depressive symptoms and stress what with her husband's illness and longer recovery. She was not sleeping very well because she was listing out for him. Both daughters were helping out. She was having more daytime somnolence. I suggested we start her on low-dose Lexapro generic 5 mg strength for her symptoms of depression. I kept her memory medications the same. She called in the interim in May reporting intermittent headaches which were responding to Tylenol as needed.  I saw her on 01/31/2015, at which time she reported having more stress d/t her husband's interim illness and surgery with prolonged recovery. Her daughter reported that she was having difficulty with confusion, orientation, word finding difficulties. She was on B12 injections. Her daughters were helpful and she also had help from neighbors and friends.  Her MMSE was 14/30, CDT: 1/4, AFT: 8/min. I suggested we continue with generic Aricept 10 mg once daily but add Namenda long-acting 7 mg once daily to it. She was seen in the interim by Cecille Rubin, NP on 04/03/2015, at which time Namenda XR was increased to 14 mg once daily. Her MMSE was 17/30 at the time, AFT 5/min.  I saw her on 07/31/2014, at which time she reported doing well. She had been able to tolerate donepezil at 10 mg strength. She had not had any changes in her medications  but she was diagnosed with B12 deficiency. This was actually diagnosed by her primary care physician some months or maybe even years ago. She had never been on injections with B12 and was supposed to start them the week after. She had no new complaints. She was driving. She was trying to stay active mentally and physically. She was trying to work out regularly and was trying to eat healthy. Her MMSE was 18/30, CDT 2/4, and AFT was 11 per minute. I did not suggest any new medications, especially in light of her diagnosis of B12 deficiency and starting B12 injections soon.   I saw her on 05/01/2014, at which time I increased her Aricept from 5 mg to 10 mg. Her MMSE was stable at 15 out of 30 at the time. In the interim, she saw Dr. Valentina Shaggy on 06/12/2014 and I reviewed his neuropsychological test results and reported in detail. Findings are suggestive of mild dementia, possibly of  the Alzheimer's type.  I first met her on 01/30/2014, at which time she reported an approximately 6-12 month history of progressive memory loss. She had had recent blood work and a brain MRI which we discussed. I referred her for formal memory testing in the form of neuropsychological evaluation and she is been scheduled with Dr. Valentina Shaggy in January 2016. I suggested we start the patient on low-dose Aricept 5 mg once daily. I advised her not to resume driving at this time.  She had a brain MRI without contrast on 2014/01/07: 1. No evidence of acute intracranial abnormality or mass. 2. Mild chronic small vessel ischemic disease and mild cerebral atrophy. In addition I personally reviewed the images through the PACS system. She had blood work with her PCP on 11/28/2013 which showed a normal CBC with differential, normal CMP, normal thyroid function test, elevated LDL at 123, low normal vitamin B12 at 346. She has had problems with short-term memory over the past year, worse over the past 5-6 months. She got lost driving one time recently  when picking up her grandchild. She became confused recently one time while grocery shopping. She is an only child and took care of both her parents. Her father lived to be 68 and died in 01-07-2013 and her mother died 30 years earlier at 46. She has 2 grown daughters and they have 2 GC. 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 She does not smoke or drink EtOH. There is no FHx of dementia.  She primarily has been having difficulty with short-term memory such as forgetfulness, misplacing things, asking the same question again and forgetting dates and events. There is report of rare confusion or disorientation. Familiar faces are easily recognized. She reports no recurrent headaches, but reports one recent mild HA. She denies VH and AH and delusions.  She denies depression. She denies SI/HI. She had a lot of stress when she took care of her parents.  The patient denies prior TIA or stroke symptoms, such as sudden onset of one sided weakness, numbness, tingling, slurring of speech or droopy face, hearing loss, tinnitus, diplopia or visual field cut or monocular loss of vision, and denies recurrent headaches.  Of note, the patient has very mild snoring, and there is no report of witnessed apneas or choking sensations while asleep.    REVIEW OF SYSTEMS: Out of a complete 14 system review of symptoms, the patient complains only of the following symptoms, memory loss, dizziness, weakness and all other reviewed systems are negative.  ALLERGIES: No Known Allergies  HOME MEDICATIONS: Outpatient Medications Prior to Visit  Medication Sig Dispense Refill  . allopurinol (ZYLOPRIM) 300 MG tablet Take 300 mg by mouth daily.  12  . colchicine 0.6 MG tablet Take 0.6 mg by mouth daily.  12  . escitalopram (LEXAPRO) 5 MG tablet TAKE 1 TABLET BY MOUTH EVERY DAY 90 tablet 3  . losartan (COZAAR) 100 MG tablet Take 100 mg by mouth daily.    . Multiple  Vitamins-Minerals (MULTIVITAMIN ADULT EXTRA C PO) multivitamin  1 tablet by mouth daily    . Cyanocobalamin (B-12) 1000 MCG/ML KIT Inject as directed every 30 (thirty) days.    Marland Kitchen donepezil (ARICEPT) 10 MG tablet TAKE 1 TABLET BY MOUTH EVERYDAY AT BEDTIME 30 tablet 0  . memantine (NAMENDA XR) 28 MG CP24 24 hr capsule TAKE 1 CAPSULE BY MOUTH AT BEDTIME. PT NEEDS A FOLLOW UP FOR FURTHER REFILLS PER DOCTOR 30  capsule 0  . predniSONE (STERAPRED UNI-PAK 21 TAB) 10 MG (21) TBPK tablet Take by mouth daily. Take 6 tabs by mouth daily  for 2 days, then 5 tabs for 2 days, then 4 tabs for 2 days, then 3 tabs for 2 days, 2 tabs for 2 days, then 1 tab by mouth daily for 2 days 42 tablet 0   No facility-administered medications prior to visit.     PAST MEDICAL HISTORY: Past Medical History:  Diagnosis Date  . Gout   . Hypertension   . Vitamin B deficiency     PAST SURGICAL HISTORY: No past surgical history on file.  FAMILY HISTORY: Family History  Problem Relation Age of Onset  . Cancer Mother        skin     SOCIAL HISTORY: Social History   Socioeconomic History  . Marital status: Married    Spouse name: [name]  . Number of children: 2  . Years of education: 25  . Highest education level: Not on file  Occupational History    Employer: RETIRED    Comment: retired for 12 years  Social Needs  . Financial resource strain: Not on file  . Food insecurity    Worry: Not on file    Inability: Not on file  . Transportation needs    Medical: Not on file    Non-medical: Not on file  Tobacco Use  . Smoking status: Never Smoker  . Smokeless tobacco: Never Used  Substance and Sexual Activity  . Alcohol use: No    Alcohol/week: 0.0 standard drinks  . Drug use: No  . Sexual activity: Not on file  Lifestyle  . Physical activity    Days per week: Not on file    Minutes per session: Not on file  . Stress: Not on file  Relationships  . Social Herbalist on phone: Not on file     Gets together: Not on file    Attends religious service: Not on file    Active member of club or organization: Not on file    Attends meetings of clubs or organizations: Not on file    Relationship status: Not on file  . Intimate partner violence    Fear of current or ex partner: Not on file    Emotionally abused: Not on file    Physically abused: Not on file    Forced sexual activity: Not on file  Other Topics Concern  . Not on file  Social History Narrative   Patient is right handed and resides with husband      PHYSICAL EXAM  Vitals:   01/30/19 0903  BP: 104/64  Pulse: 77  Temp: (!) 97.1 F (36.2 C)  Weight: 132 lb (59.9 kg)  Height: _0  (1.702 m)   Body mass index is 20.67 kg/m.   Generalized: Well developed, in no acute distress  Cardiology: normal rate and rhythm, no murmur noted Neurological examination  Mentation: Not oriented, mostly nonverbal today, not following commands  Cranial nerve II-XII: Pupils were equal round reactive to light. Patient not following commands to complete exam  Motor: patient is able to stand and mobilize to wheelchair, unable to perform complete assessment  Sensory: unable to assess  Coordination: unable to assess Gait and station: Gait is short and unsteady, wheelchair used for transportation today    DIAGNOSTIC DATA (LABS, IMAGING, TESTING) - I reviewed patient records, labs, notes, testing and imaging myself where available.  MMSE -  Mini Mental State Exam 10/14/2017 06/08/2016 03/09/2016  Orientation to time 0 2 1  Orientation to Place _0 Registration _1 Attention/ Calculation 0 0 0  Recall 0 0 0  Language- name 2 objects _2 Language- repeat 0 1 1  Language- follow 3 step command _3 Language- read & follow direction 0 0 1  Write a sentence 0 0 0  Copy design 0 0 0  Total score _4 No results found for: WBC, HGB, HCT, MCV, PLT No results found for: NA, K, CL, CO2, GLUCOSE, BUN, CREATININE,  CALCIUM, PROT, ALBUMIN, AST, ALT, ALKPHOS, BILITOT, GFRNONAA, GFRAA No results found for: CHOL, HDL, LDLCALC, LDLDIRECT, TRIG, CHOLHDL No results found for: HGBA1C No results found for: VITAMINB12 No results found for: TSH     ASSESSMENT AND PLAN 73 y.o. year old female  has a past medical history of Gout, Hypertension, and Vitamin B deficiency. here with     ICD-10-CM   1. Alzheimer's dementia without behavioral disturbance, unspecified timing of dementia onset (HCC)  G30.9 memantine (NAMENDA XR) 28 MG CP24 24 hr capsule   F02.80 donepezil (ARICEPT) 10 MG tablet   Ms. Mcauliffe continues to show progression with dementia.  She is tolerating Namenda and Aricept well.  We will continue these medications.  I have advised that they have the caregiver provide medications at appropriate times to ensure that she is getting these consistently.  I have also had a long discussion regarding progression of dementia.  Her husband is adamant that she remain in the home.  I have encouraged safety as a priority.  I have also provided community resources.  I encouraged close follow-up with primary care as well.  We will follow-up with her in 6 months, sooner if needed.  Her daughter verbalizes understanding and agreement with this plan.    No orders of the defined types were placed in this encounter.    Meds ordered this encounter  Medications  . memantine (NAMENDA XR) 28 MG CP24 24 hr capsule    Sig: TAKE 1 CAPSULE BY MOUTH AT BEDTIME. PT NEEDS A FOLLOW UP FOR FURTHER REFILLS PER DOCTOR    Dispense:  30 capsule    Refill:  11    Order Specific Question:   Supervising Provider    Answer:   Melvenia Beam [id number]  . donepezil (ARICEPT) 10 MG tablet    Sig: TAKE 1 TABLET BY MOUTH EVERYDAY AT BEDTIME    Dispense:  30 tablet    Refill:  11    Pt needs an appt for further refills.    Order Specific Question:   Supervising Provider    Answer:   Melvenia Beam [[id number]]      I spent 15 minutes  with the patient. 50% of this time was spent counseling and educating patient on plan of care and medications.    Debbora Presto, FNP-C 01/30/2019, 12:45 PM [facility] [address] [place], [place] [address] [phone]   I reviewed the above note and documentation by the Nurse Practitioner and agree with the history, exam, assessment and plan as outlined above. I was immediately available for consultation. Star Age, MD, PhD [facility])

## 2019-04-24 ENCOUNTER — Telehealth: Payer: Self-pay

## 2019-04-24 NOTE — Telephone Encounter (Signed)
Received an urgent message from our phone staff. [facility] called and said that pt was taken to ER on Saturday because she was found unresponsive. They want to know if Dr. Rexene Alberts will agree to hospice attending. I spoke with Dr. Rexene Alberts and she has declined being the hospice attending and recommends that hospice orders come from PCP. This was relayed to our phone staff.

## 2019-05-02 DEATH — deceased

## 2019-07-26 ENCOUNTER — Ambulatory Visit: Payer: Self-pay | Admitting: Family Medicine

## 2019-07-26 ENCOUNTER — Encounter: Payer: Self-pay | Admitting: Family Medicine

## 2019-07-26 NOTE — Progress Notes (Deleted)
PATIENT: Kristi [name] DOB: 1945-09-06  REASON FOR VISIT: follow up HISTORY FROM: patient  No chief complaint on file.    HISTORY OF PRESENT ILLNESS: Today 07/26/19 Kristi [name] is a 74 y.o. female here today for follow up.   HISTORY: (copied from my note on 01/30/2019)  Kristi [name] is a 74 y.o. female here today for follow up of dementia. She presents today with her daughter, [name], who reports that she has had a significant decline over the past year. Kiarah's husband was very ill over the past year. The family was trying to take care of both parents. She admits that there have been some inconsistencies with medications.She was taking Namenda XR and Aricept but family is not sure if she has been taking it recently. She requires full care most of the time. She can't use spoon or fork. Occasionally she can feed herself finger foods. She has lost some weight over the past year. She has been more sleepy. Sometimes it is difficult to arouse her. She has had multiple falls. Her balance remains an issue. She denies injuries with falls. She is not using any assistive devices when ambulating. They have tried to get her to use a walker in the past but she would not comply. They have installed motion activated lights. They have locks on the doors. She does not drive.   Xinyi lives with her husband at home. Mr Folmar is doing better and family feels he is capable of caring for Lada. [name] usually helps her parents during the day but has a 70 year old and her husband is a dialysis patient. Her husband has a health care aide that comes four days a week from 4-9 (not caring for [place]). Her family does not feel they can afford private care for [facility].    HISTORY: (copied from Dr Guadelupe Sabin note on 12/09/2017)  Interim history:  Ms. Monceaux is a 74 year old right-handed woman with an underlying medical history of hypertension and arthritis/gout, who presents for follow-up consultation of  her progressive dementia. She is accompanied by her husband, one of her daughters, and her brother in law, Ralph,today. I last saw her on 06/02/2017, which time she reported some loss of appetite. Her brother-in-law was helping out as much as he could. Her husband was also having his own medical issues. I suggested we continue with her medications for her memory loss  She was seen by Cecille Rubin, NP,in the interim 10/14/2017, at which time her MMSE was 5 out of 30. Clock drawing was 0/0 and animal fluency was 1/m.  Today, 12/09/2017: She reportsvery little on her own. She is conversing well but has significant difficulty finding her words and explaining what she is trying to say. Her other daughter, [name], hasquestionedwhether she has hearing loss. Her husband wonders about it toobut [name], who is here today,does not notice hearing loss as much as may be difficulty processing what she hears. There is no obvious anxiety or depression, she had some milder visual hallucinations in the recent past, sometimes something she watches on TV may seem real to her.Appetite is generally okay, but she eats more snacks such as potato chips, popcorn, chocolate bars, wt is stable.  The patient's allergies, current medications, family history, past medical history, past social history, past surgical history and problem list were reviewed and updated as appropriate.  Previously (copied from previous notes for reference):  I saw her on 10/06/2016, at which time her husband noted that she  was more sleepy during the day. We talked about her driving again and she was advised no longer to drive. She had no significant depression or anxiety. She was advised to continue her dual memory medications.   I saw her on 12/02/2015, at which time she reported doing well. She try to stay active. She was not driving at the time but requested to drive shorter distances. I did ask her to no longer drive. Mood wise, she felt  stable. She was taking low-dose Lexapro. Her MMSE was 14 out of 30 at the time, animal fluency 8.  She saw Cecille Rubin in the interim on 03/09/2016, at which time her MMSE showed a score of 13, she was unable to draw a clock and AFT was 2 in 30 seconds.  She saw Cecille Rubin on 06/08/2016, at which time her MMSE was 12/30, CDT 0/4. She was kept on Namenda XR 28 mg, generic Aricept 10 mg daily and Lexapro 5 mg daily.  I saw her on 06/06/2015, at which time she was tolerating generic Aricept 10 mg daily and Namenda long-acting 14 mg daily, reported however interim depressive symptoms and stress what with her husband's illness and longer recovery. She was not sleeping very well because she was listing out for him. Both daughters were helping out. She was having more daytime somnolence. I suggested we start her on low-dose Lexapro generic 5 mg strength for her symptoms of depression. I kept her memory medications the same. She called in the interim in May reporting intermittent headaches which were responding to Tylenol as needed.  I saw her on 01/31/2015, at which time she reported having more stress d/t her husband's interim illness and surgery with prolonged recovery. Her daughter reported that she was having difficulty with confusion, orientation, word finding difficulties. She was on B12 injections. Her daughters were helpful and she also had help from neighbors and friends.  Her MMSE was 14/30, CDT: 1/4, AFT: 8/min. I suggested we continue with generic Aricept 10 mg once daily but add Namenda long-acting 7 mg once daily to it. She was seen in the interim by Cecille Rubin, NP on 04/03/2015, at which time Namenda XR was increased to 14 mg once daily. Her MMSE was 17/30 at the time, AFT 5/min.  I saw her on 07/31/2014, at which time she reported doing well. She had been able to tolerate donepezil at 10 mg strength. She had not had any changes in her medications but she was diagnosed with B12  deficiency. This was actually diagnosed by her primary care physician some months or maybe even years ago. She had never been on injections with B12 and was supposed to start them the week after. She had no new complaints. She was driving. She was trying to stay active mentally and physically. She was trying to work out regularly and was trying to eat healthy. Her MMSE was 18/30, CDT 2/4, and AFT was 11 per minute. I did not suggest any new medications, especially in light of her diagnosis of B12 deficiency and starting B12 injections soon.   I saw her on 05/01/2014, at which time I increased her Aricept from 5 mg to 10 mg. Her MMSE was stable at 15 out of 30 at the time. In the interim, she saw Dr. Valentina Shaggy on 06/12/2014 and I reviewed his neuropsychological test results and reported in detail. 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  I first met her on 01/30/2014, at which time  she reported an approximately 6-12 month history of progressive memory loss. She had had recent blood work and a brain MRI which we discussed. I referred her for formal memory testing in the form of neuropsychological evaluation and she is been scheduled with Dr. Valentina Shaggy in January 2016. I suggested we start the patient on low-dose Aricept 5 mg once daily. I advised her not to resume driving at this time.  She had a brain MRI without contrast on 12/12/2013: 1. No evidence of acute intracranial abnormality or mass. 2. Mild chronic small vessel ischemic disease and mild cerebral atrophy. In addition I personally reviewed the images through the PACS system. She had blood work with her PCP on 11/28/2013 which showed a normal CBC with differential, normal CMP, normal thyroid function test, elevated LDL at 123, low normal vitamin B12 at 346. She has had problems with short-term memory over the past year, worse over the past 5-6 months. She got lost driving one time recently when picking up her  grandchild. She became confused recently one time while grocery shopping. She is an only child and took care of both her parents. Her father lived to be 37 and died in 12-10-2012 and her mother died 6 years earlier at 79. She has 2 grown daughters and they have 2 GC. 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 She does not smoke or drink EtOH. There is no FHx of dementia.  She primarily has been having difficulty with short-term memory such as forgetfulness, misplacing things, asking the same question again and forgetting dates and events. There is report of rare confusion or disorientation. Familiar faces are easily recognized. She reports no recurrent headaches, but reports one recent mild HA. She denies VH and AH and delusions.  She denies depression. She denies SI/HI. She had a lot of stress when she took care of her parents.  The patient denies prior TIA or stroke symptoms, such as sudden onset of one sided weakness, numbness, tingling, slurring of speech or droopy face, hearing loss, tinnitus, diplopia or visual field cut or monocular loss of vision, and denies recurrent headaches.  Of note, the patient has very mild snoring, and there is no report of witnessed apneas or choking sensations while asleep.   REVIEW OF SYSTEMS: Out of a complete 14 system review of symptoms, the patient complains only of the following symptoms, and all other reviewed systems are negative.  ALLERGIES: No Known Allergies  HOME MEDICATIONS: Outpatient Medications Prior to Visit  Medication Sig Dispense Refill  . allopurinol (ZYLOPRIM) 300 MG tablet Take 300 mg by mouth daily.  12  . colchicine 0.6 MG tablet Take 0.6 mg by mouth daily.  12  . donepezil (ARICEPT) 10 MG tablet TAKE 1 TABLET BY MOUTH EVERYDAY AT BEDTIME 30 tablet 11  . escitalopram (LEXAPRO) 5 MG tablet TAKE 1 TABLET BY MOUTH EVERY DAY 90 tablet 3  . losartan (COZAAR) 100 MG tablet Take 100 mg by mouth  daily.    . memantine (NAMENDA XR) 28 MG CP24 24 hr capsule TAKE 1 CAPSULE BY MOUTH AT BEDTIME. PT NEEDS A FOLLOW UP FOR FURTHER REFILLS PER DOCTOR 30 capsule 11  . Multiple Vitamins-Minerals (MULTIVITAMIN ADULT EXTRA C PO) multivitamin  1 tablet by mouth daily     No facility-administered medications prior to visit.    PAST MEDICAL HISTORY: Past Medical History:  Diagnosis Date  . Gout   . Hypertension   . Vitamin B  deficiency     PAST SURGICAL HISTORY: No past surgical history on file.  FAMILY HISTORY: Family History  Problem Relation Age of Onset  . Cancer Mother        skin     SOCIAL HISTORY: Social History   Socioeconomic History  . Marital status: Married    Spouse name: [name]  . Number of children: 2  . Years of education: 110  . Highest education level: Not on file  Occupational History    Employer: RETIRED    Comment: retired for 12 years  Tobacco Use  . Smoking status: Never Smoker  . Smokeless tobacco: Never Used  Substance and Sexual Activity  . Alcohol use: No    Alcohol/week: 0.0 standard drinks  . Drug use: No  . Sexual activity: Not on file  Other Topics Concern  . Not on file  Social History Narrative   Patient is right handed and resides with husband   Social Determinants of Health   Financial Resource Strain:   . Difficulty of Paying Living Expenses: Not on file  Food Insecurity:   . Worried About Charity fundraiser in the Last Year: Not on file  . Ran Out of Food in the Last Year: Not on file  Transportation Needs:   . Lack of Transportation (Medical): Not on file  . Lack of Transportation (Non-Medical): Not on file  Physical Activity:   . Days of Exercise per Week: Not on file  . Minutes of Exercise per Session: Not on file  Stress:   . Feeling of Stress : Not on file  Social Connections:   . Frequency of Communication with Friends and Family: Not on file  . Frequency of Social Gatherings with Friends and Family: Not on file  .  Attends Religious Services: Not on file  . Active Member of Clubs or Organizations: Not on file  . Attends Archivist Meetings: Not on file  . Marital Status: Not on file  Intimate Partner Violence:   . Fear of Current or Ex-Partner: Not on file  . Emotionally Abused: Not on file  . Physically Abused: Not on file  . Sexually Abused: Not on file      PHYSICAL EXAM  There were no vitals filed for this visit. There is no height or weight on file to calculate BMI.  Generalized: Well developed, in no acute distress  Cardiology: normal rate and rhythm, no murmur noted Neurological examination  Mentation: Alert oriented to time, place, history taking. Follows all commands speech and language fluent Cranial nerve II-XII: Pupils were equal round reactive to light. Extraocular movements were full, visual field were full on confrontational test. Facial sensation and strength were normal. Uvula tongue midline. Head turning and shoulder shrug  were normal and symmetric. Motor: The motor testing reveals 5 over 5 strength of all 4 extremities. Good symmetric motor tone is noted throughout.  Sensory: Sensory testing is intact to soft touch on all 4 extremities. No evidence of extinction is noted.  Coordination: Cerebellar testing reveals good finger-nose-finger and heel-to-shin bilaterally.  Gait and station: Gait is normal. Tandem gait is normal. Romberg is negative. No drift is seen.  Reflexes: Deep tendon reflexes are symmetric and normal bilaterally.   DIAGNOSTIC DATA (LABS, IMAGING, TESTING) - I reviewed patient records, labs, notes, testing and imaging myself where available.  MMSE - Mini Mental State Exam 10/14/2017 06/08/2016 03/09/2016  Orientation to time 0 2 1  Orientation to Place 2 3 3  Registration _0 Attention/ Calculation 0 0 0  Recall 0 0 0  Language- name 2 objects _1 Language- repeat 0 1 1  Language- follow 3 step command _2 Language- read & follow  direction 0 0 1  Write a sentence 0 0 0  Copy design 0 0 0  Total score _3 No results found for: WBC, HGB, HCT, MCV, PLT No results found for: NA, K, CL, CO2, GLUCOSE, BUN, CREATININE, CALCIUM, PROT, ALBUMIN, AST, ALT, ALKPHOS, BILITOT, GFRNONAA, GFRAA No results found for: CHOL, HDL, LDLCALC, LDLDIRECT, TRIG, CHOLHDL No results found for: HGBA1C No results found for: VITAMINB12 No results found for: TSH     ASSESSMENT AND PLAN 74 y.o. year old female  has a past medical history of Gout, Hypertension, and Vitamin B deficiency. here with ***  No diagnosis found.     No orders of the defined types were placed in this encounter.    No orders of the defined types were placed in this encounter.     I spent 15 minutes with the patient. 50% of this time was spent counseling and educating patient on plan of care and medications.    Debbora Presto, FNP-C 07/26/2019, 7:51 AM [facility] [address], [place], [place] [address] [phone]

## 2020-07-15 IMAGING — DX DG CHEST 2V
2 series · 2 of 2 positions shown · non-contrast
Comparison: Chest x-ray dated May 22, 2018.

CLINICAL DATA: Cough and wheezing for the past 3 weeks.

EXAM:
CHEST - 2 VIEW

[chest pa]
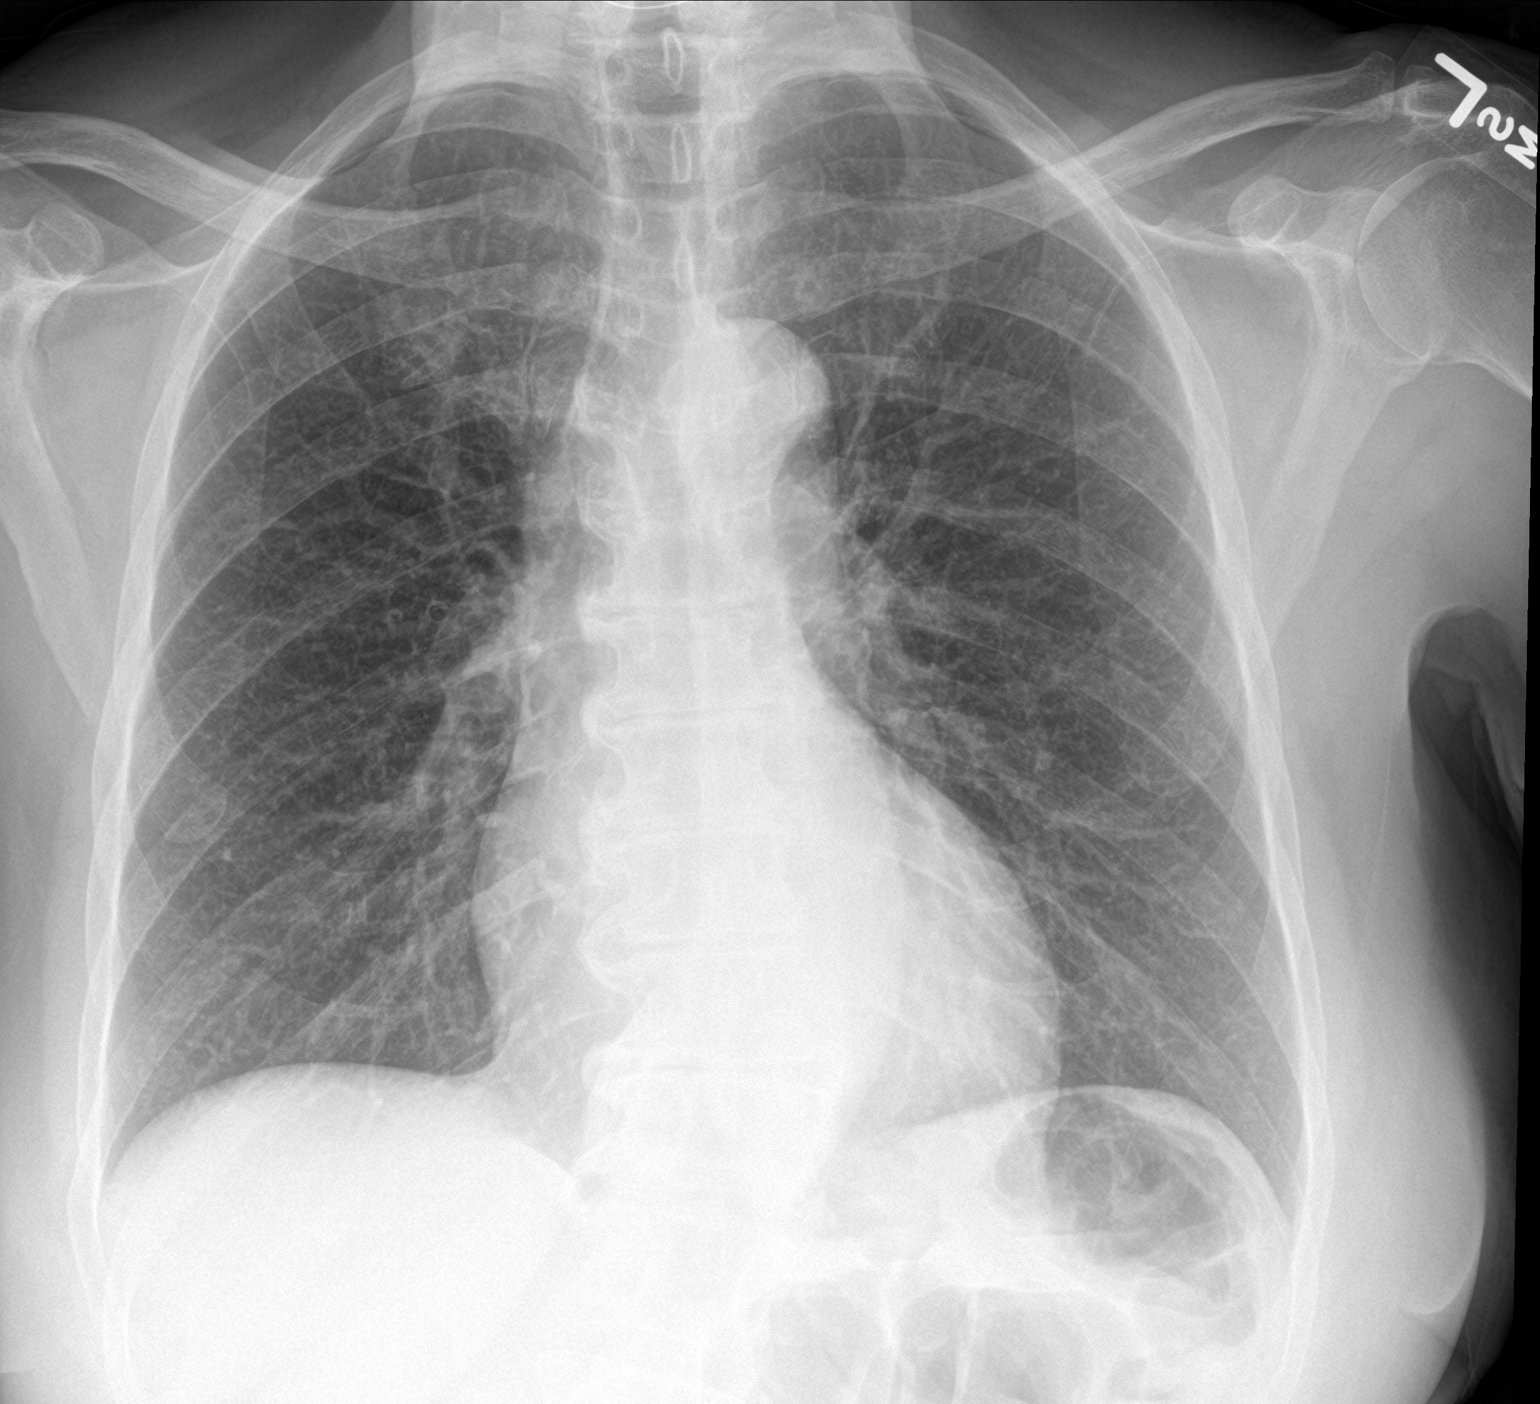

[chest lat]
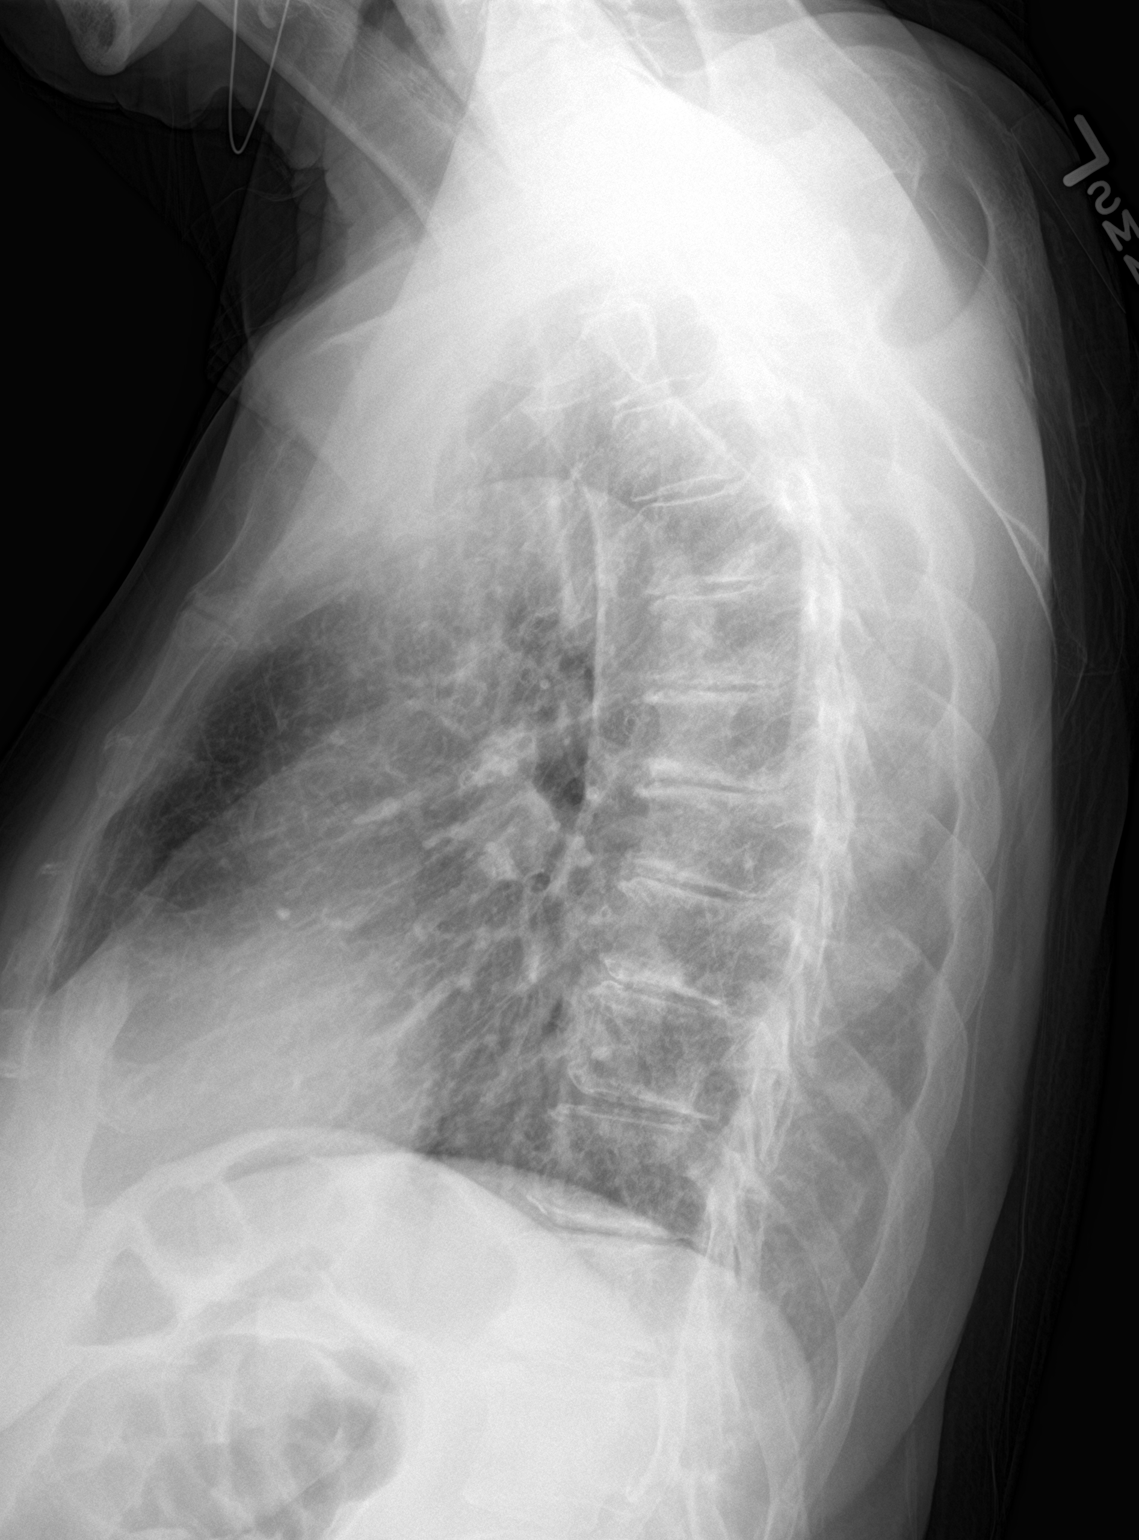

[2 of 2 positions shown; findings below may reference images not displayed]

FINDINGS: The heart size and mediastinal contours are within normal limits.
Normal pulmonary vascularity. Atherosclerotic calcification of the
aortic arch. No focal consolidation, pleural effusion, or
pneumothorax. No acute osseous abnormality.
IMPRESSION: No active cardiopulmonary disease.

## 2023-12-17 ENCOUNTER — Encounter: Payer: Self-pay | Admitting: Advanced Practice Midwife
